# Patient Record
Sex: Female | Born: 1975 | Race: White | Hispanic: No | Marital: Married | Smoking: Never smoker
Health system: Southern US, Community
[De-identification: ages and names within clinical notes are randomized; demographics above are authoritative.]

---

## 2006-02-26 ENCOUNTER — Emergency Department (HOSPITAL_COMMUNITY): Admission: EM | Admit: 2006-02-26 | Discharge: 2006-02-26 | Payer: Self-pay | Admitting: Emergency Medicine

## 2006-03-12 ENCOUNTER — Emergency Department (HOSPITAL_COMMUNITY): Admission: EM | Admit: 2006-03-12 | Discharge: 2006-03-12 | Payer: Self-pay | Admitting: Emergency Medicine

## 2007-06-25 IMAGING — US US OB COMP LESS 14 WK
1 series · 14 of 28 positions shown · non-contrast
Comparison: none

CLINICAL DATA: 30-year-old with vomiting and abdominal pain.  LMP would make the patient 5 weeks and 5 days, yielding EDC of 11/07/06.
 OBSTETRICAL ULTRASOUND <14 WKS AND TRANSVAGINAL OB US:
TECHNIQUE: Both transabdominal and transvaginal ultrasound examinations were performed for complete evaluation of the gestation as well as the maternal uterus, adnexal regions, and pelvic cul-de-sac.

[Series 1: ob · 0.25mm/px · 14 of 51 slices shown]
[im 2/51]
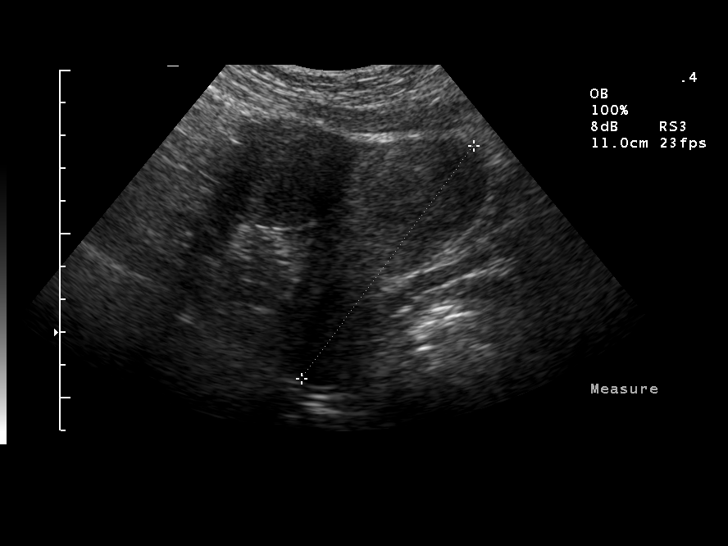
[im 6/51]
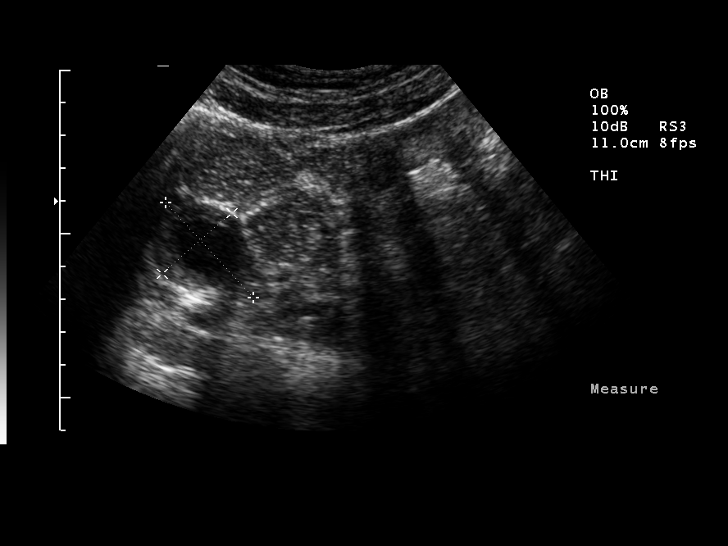
[im 10/51]
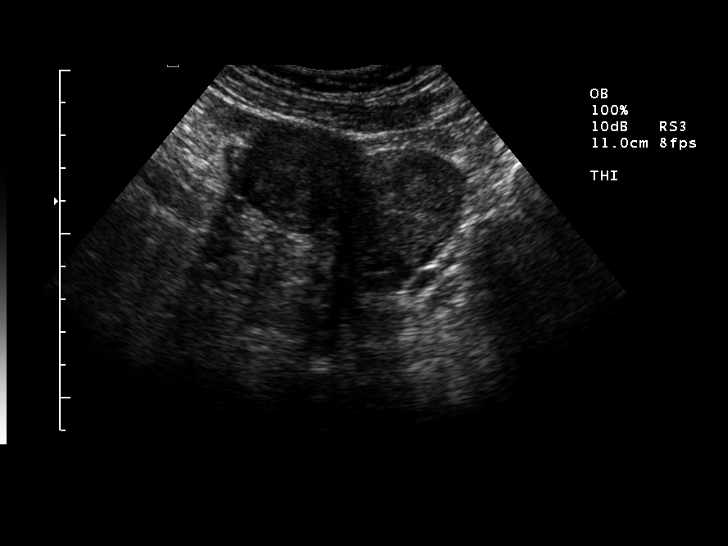
[im 13/51]
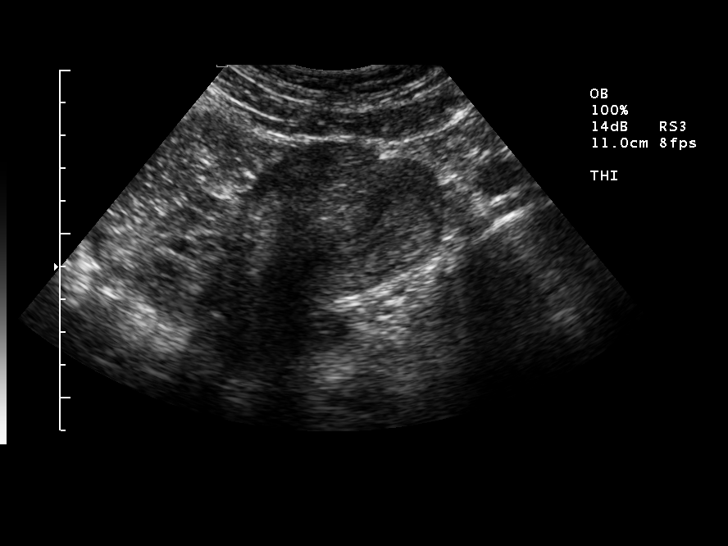
[im 17/51]
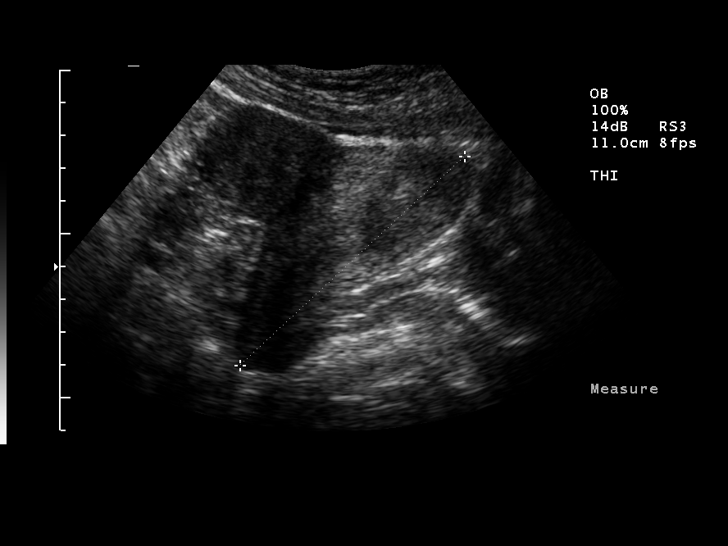
[im 21/51]
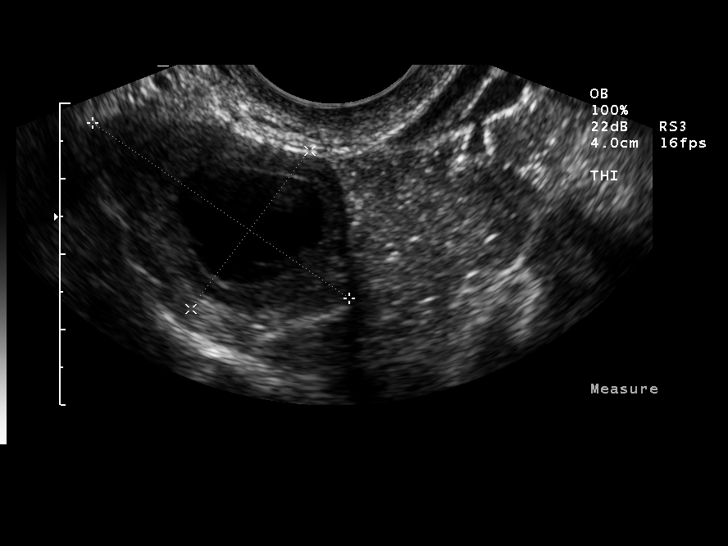
[im 25/51]
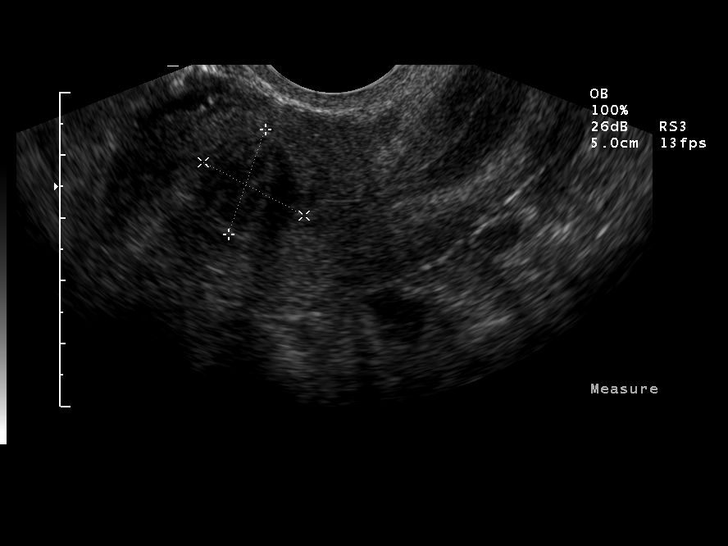
[im 28/51]
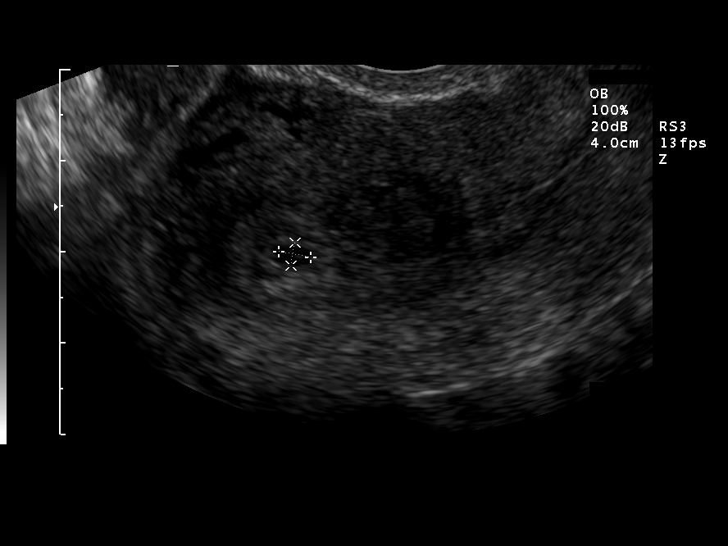
[im 32/51]
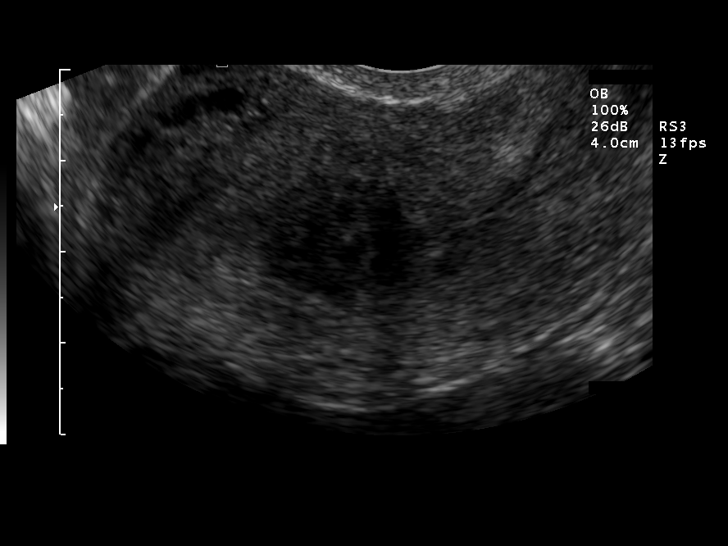
[im 36/51]
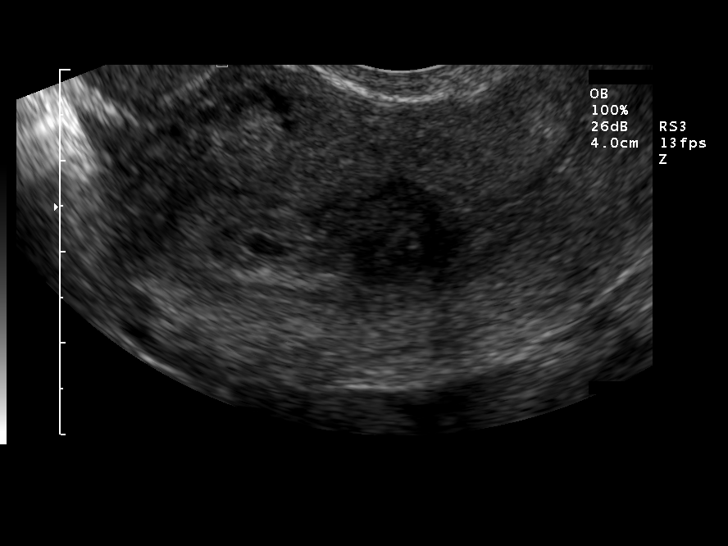
[im 39/51]
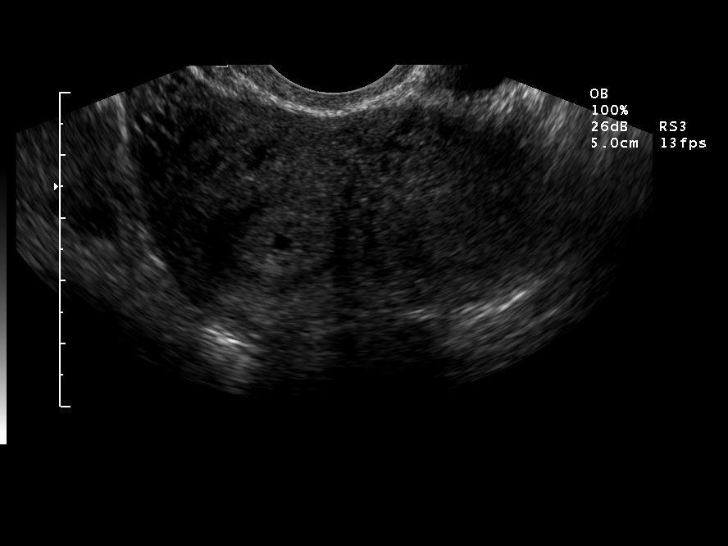
[im 43/51]
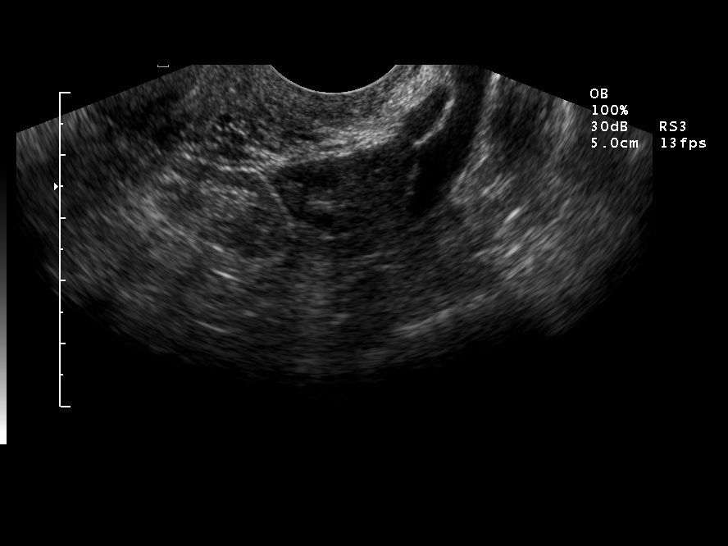
[im 47/51]
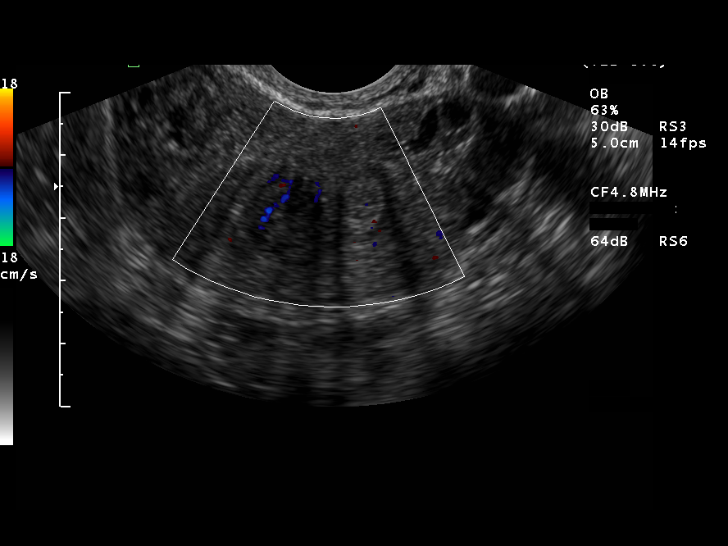
[im 51/51]
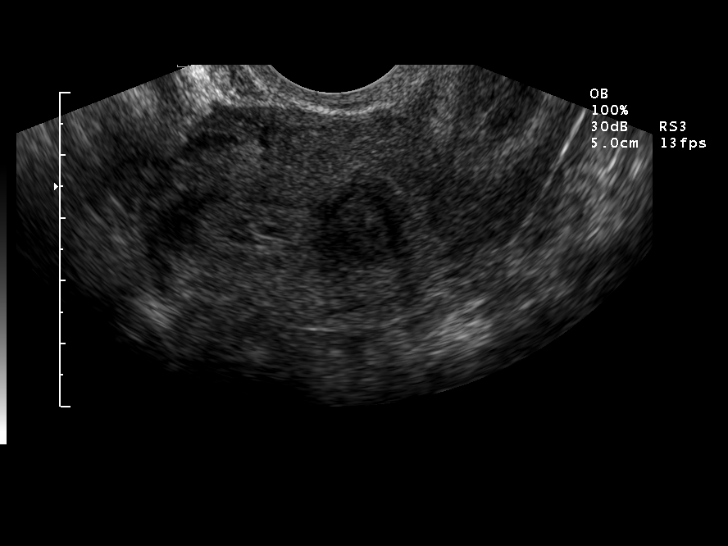

[14 of 28 positions shown; findings below may reference images not displayed]

FINDINGS: Within the uterus, there is a well-defined gestational sac with a mean sac diameter of 3 mm.  This corresponds to an age of 5 weeks 0 days.  Small yolk sac is visualized.  Embryonic pole is not seen.  No subchorionic hemorrhage is identified.
 There is a probable submucosal fibroid in the central aspect of the uterus.  This measures 1.8 x 1.8 x 1.7 cm.  A pedunculated fundal fibroid is 3.9 x 3.6 x 4.1 cm.  The ovaries have a normal appearance.  Note is made of right corpus luteum cyst 2.2 cm in diameter.
IMPRESSION: 1.  Intrauterine gestational sac.
 2.  Submucosal fibroid in the central portion of the uterus.
 3.  Pedunculated fibroid.

## 2020-09-16 ENCOUNTER — Ambulatory Visit (HOSPITAL_COMMUNITY)
Admission: EM | Admit: 2020-09-16 | Discharge: 2020-09-16 | Disposition: A | Discharge status: Home / Self Care | Payer: No Payment, Other | Attending: Psychiatry | Admitting: Psychiatry

## 2020-09-16 ENCOUNTER — Other Ambulatory Visit: Payer: Self-pay

## 2020-09-16 DIAGNOSIS — F331 Major depressive disorder, recurrent, moderate: Secondary | ICD-10-CM | POA: Insufficient documentation

## 2020-09-16 MED ORDER — TRAZODONE HCL 50 MG PO TABS: 50.0000 mg | ORAL_TABLET | Freq: Every day | ORAL | 0 refills | Status: DC

## 2020-09-16 MED ORDER — CITALOPRAM HYDROBROMIDE 20 MG PO TABS: 20.0000 mg | ORAL_TABLET | Freq: Every day | ORAL | 0 refills | Status: DC

## 2020-09-16 NOTE — Discharge Instructions (Addendum)

## 2020-09-16 NOTE — ED Provider Notes (Signed)
[facility] Medical Screening Exam  Patient Name: Elizabeth Barber MRN: [id number] Date of Evaluation: 09/16/20 Chief Complaint:   Diagnosis:  Final diagnoses:  MDD (major depressive disorder), recurrent episode, moderate (HCC)    History of Present illness: Elizabeth Barber is a 44 y.o. female.  Patient presents voluntarily as a walk-in to the [facility].  Patient reports that she has that with depression since her mid 56s and has been on various medications over the years.  She states that she usually runs into issues of moving, losing her job, or having no insurance and has had to stop medications.  She reports that the last medication she was on with Celexa 20 mg p.o. daily.  She states that it seemed to work pretty well for her but was unable to continue it because she did not sign up for the program at the community clinic in [facility].  She reports a history of being on Wellbutrin (which she really liked), Zoloft, Prozac, and Lexapro which she did not like any of those 3.  She reports that she lives with her husband and her 60 year old daughter.  She states that she has been having some worsening depressive symptoms because of being off of her medications and not been in therapy.  She states that she is looking to restart her medications as well as find a new place to be seen.  She states she went to [facility] and they informed her that would be approximately 2 months for her to be seen.  After discussing with patient I have agreed to restart her Celexa 20 mg p.o. daily, and add trazodone 50 mg p.o. nightly as patient reports disrupted sleep.  She denies having any suicidal or homicidal ideations and denies any hallucinations.  Patient's medications be E prescribed to pharmacy of choice.  Patient is also informed of open access at the [facility] and provided with information for days and hours of operation.  Psychiatric Specialty Exam  Presentation  General Appearance:Appropriate for  Environment; Casual  Eye Contact:Good  Speech:Clear and Coherent; Normal Rate  Speech Volume:Normal  Handedness:Right   Mood and Affect  Mood:Depressed  Affect:Appropriate; Congruent   Thought Process  Thought Processes:Coherent  Descriptions of Associations:Intact  Orientation:Full (Time, Place and Person)  Thought Content:WDL  Hallucinations:None  Ideas of Reference:None  Suicidal Thoughts:No  Homicidal Thoughts:No   Sensorium  Memory:Immediate Good; Recent Good; Remote Good  Judgment:Good  Insight:Good   Executive Functions  Concentration:Good  Attention Span:Good  Recall:Good  Fund of Knowledge:Good  Language:Good   Psychomotor Activity  Psychomotor Activity:Normal   Assets  Assets:Communication Skills; Desire for Improvement; Housing; Social Support; Physical Health; Transportation   Sleep  Sleep:Fair  Number of hours: No data recorded  Physical Exam: Physical Exam Vitals and nursing note reviewed.  Constitutional:      Appearance: She is well-developed.  HENT:     Head: Normocephalic.  Eyes:     Pupils: Pupils are equal, round, and reactive to light.  Cardiovascular:     Rate and Rhythm: Normal rate.  Pulmonary:     Effort: Pulmonary effort is normal.  Musculoskeletal:        General: Normal range of motion.  Neurological:     Mental Status: She is alert and oriented to person, place, and time.    Review of Systems  Constitutional: Negative.   HENT: Negative.   Eyes: Negative.   Respiratory: Negative.   Cardiovascular: Negative.   Gastrointestinal: Negative.   Genitourinary: Negative.  Musculoskeletal: Negative.   Skin: Negative.   Neurological: Negative.   Endo/Heme/Allergies: Negative.   Psychiatric/Behavioral: Positive for depression.   Blood pressure 139/78, pulse (!) 118, temperature 98.6 F (37 C), temperature source Tympanic, resp. rate 18, height 5\' 4"  (1.626 m), weight 220 lb (99.8 kg), SpO2 100 %.  Body mass index is 37.76 kg/m.  Musculoskeletal: Strength & Muscle Tone: within normal limits Gait & Station: normal Patient leans: N/A   [facility] MSE Discharge Disposition for Follow up and Recommendations: Based on my evaluation the patient does not appear to have an emergency medical condition and can be discharged with resources and follow up care in outpatient services for Medication Management and Individual Therapy   , FNP 09/16/2020, 2:59 PM

## 2020-09-16 NOTE — Progress Notes (Signed)
Pt was discharged and received AVS. Questions were answered. Pt retrieved personal belongings and was escorted to lobby.

## 2020-09-16 NOTE — ED Triage Notes (Signed)
Pt presented as a walk-in to [facility] with chief complaint of depression. Pt reports having depression since she was in her 89s. Pt states she was receiving Celexa from "[facility] in [facility]," but has been off medication for "a few months because I didn't re-sign up." Pt states her depression has worsened over the last few weeks and is wanting to start medication again. Pt denies SI, HI, and AVH.

## 2020-09-16 NOTE — BH Assessment (Signed)
Comprehensive Clinical Assessment (CCA) Screening, Triage and Referral Note  09/16/2020 Elizabeth Barber [id number]   Patient is a 44 year old female with a history of anxiety and depression who presents voluntarily to [facility] for assessment. Patient states she was receiving celexa from [facility] in [place].  Apparently, there is a yearly renewal with their clinic, which she did not complete so the medication was discontinued.  Patient has been experiencing worsening depression and difficulty sleeping, so she contacted [facility] in [facility] to establish with a provider.  They are unable to see patient until February 2022, so they referred patient to [facility].  Patient denies SI, HI, AVH and substance use history. She is interested in referral for outpatient treatment, hoping to be seen before February.    Disposition: Per Reola Calkins, NP patient does not meet criteria for inpatient treatment.  She has been referred to [facility].  Contact information, to include open access hours, has been included in the AVS to be provided to pt upon d/c.   Chief Complaint:  Chief Complaint  Patient presents with   Depression   Visit Diagnosis: Depressive Disorder Unspecified  Patient Reported Information How did you hear about [place]? Other (Comment) (Phreesia 09/16/2020)   Referral name: [facility] Narda Bonds 09/16/2020)   Referral phone number: No data recorded Whom do you see for routine medical problems? Other (Comment) (Phreesia 09/16/2020)   Practice/Facility Name: [facility] (Phreesia 09/16/2020)   Practice/Facility Phone Number: No data recorded  Name of Contact: No data recorded  Contact Number: No data recorded  Contact Fax Number: No data recorded  Prescriber Name: No data recorded  Prescriber Address (if known): No data recorded What Is the Reason for Your Visit/Call Today? Depression (Phreesia 09/16/2020)  How Long Has This Been Causing You  Problems? 1-6 months (Phreesia 09/16/2020)  Have You Recently Been in Any Inpatient Treatment (Hospital/Detox/Crisis Center/28-Day Program)? No (Phreesia 09/16/2020)   Name/Location of Program/Hospital:No data recorded  How Long Were You There? No data recorded  When Were You Discharged? No data recorded Have You Ever Received Services From [facility] Before? No (Phreesia 09/16/2020)   Who Do You See at [facility]? No data recorded Have You Recently Had Any Thoughts About Hurting Yourself? No (Phreesia 09/16/2020)   Are You Planning to Commit Suicide/Harm Yourself At This time?  No (Phreesia 09/16/2020)  Have you Recently Had Thoughts About Hurting Someone Karolee Ohs? No (Phreesia 09/16/2020)   Explanation: No data recorded Have You Used Any Alcohol or Drugs in the Past 24 Hours? No (Phreesia 09/16/2020)   How Long Ago Did You Use Drugs or Alcohol?  No data recorded  What Did You Use and How Much? No data recorded What Do You Feel Would Help You the Most Today? Medication (Phreesia 09/16/2020)  Do You Currently Have a Therapist/Psychiatrist? No (Phreesia 09/16/2020)   Name of Therapist/Psychiatrist: No data recorded  Have You Been Recently Discharged From Any Office Practice or Programs? No (Phreesia 09/16/2020)   Explanation of Discharge From Practice/Program:  No data recorded    CCA Screening Triage Referral Assessment Type of Contact: Face-to-Face   Is this Initial or Reassessment? No data recorded  Date Telepsych consult ordered in [facility]:  No data recorded  Time Telepsych consult ordered in [facility]:  No data recorded Patient Reported Information Reviewed? Yes   Patient Left Without Being Seen? No data recorded  Reason for Not Completing Assessment: No data recorded Collateral Involvement: N/A  Does Patient  Have a Automotive engineer Guardian? No data recorded  Name and Contact of Legal Guardian:  No data recorded If Minor and Not Living with Parent(s), Who has Custody? No  data recorded Is CPS involved or ever been involved? Never  Is APS involved or ever been involved? Never  Patient Determined To Be At Risk for Harm To Self or Others Based on Review of Patient Reported Information or Presenting Complaint? No   Method: No data recorded  Availability of Means: No data recorded  Intent: No data recorded  Notification Required: No data recorded  Additional Information for Danger to Others Potential:  No data recorded  Additional Comments for Danger to Others Potential:  No data recorded  Are There Guns or Other Weapons in Your Home?  No data recorded   Types of Guns/Weapons: No data recorded   Are These Weapons Safely Secured?                              No data recorded   Who Could Verify You Are Able To Have These Secured:    No data recorded Do You Have any Outstanding Charges, Pending Court Dates, Parole/Probation? No data recorded Contacted To Inform of Risk of Harm To Self or Others: No data recorded Location of Assessment: [facility]  Does Patient Present under Involuntary Commitment? No   IVC Papers Initial File Date: No data recorded  [place] of Residence: [place]  Patient Currently Receiving the Following Services: Not Receiving Services   Determination of Need: Routine (7 days)   Options For Referral: Medication Management; Outpatient Therapy   Yetta Glassman, [facility]

## 2020-09-30 ENCOUNTER — Encounter (HOSPITAL_COMMUNITY): Payer: Self-pay

## 2020-09-30 ENCOUNTER — Telehealth (INDEPENDENT_AMBULATORY_CARE_PROVIDER_SITE_OTHER): Payer: No Payment, Other | Admitting: Psychiatry

## 2020-09-30 ENCOUNTER — Encounter (HOSPITAL_COMMUNITY): Payer: Self-pay | Admitting: Psychiatry

## 2020-09-30 ENCOUNTER — Other Ambulatory Visit: Payer: Self-pay

## 2020-09-30 DIAGNOSIS — F3341 Major depressive disorder, recurrent, in partial remission: Secondary | ICD-10-CM | POA: Insufficient documentation

## 2020-09-30 MED ORDER — TRAZODONE HCL 50 MG PO TABS: 50.0000 mg | ORAL_TABLET | Freq: Every day | ORAL | 1 refills | Status: DC

## 2020-09-30 MED ORDER — CITALOPRAM HYDROBROMIDE 20 MG PO TABS: 20.0000 mg | ORAL_TABLET | Freq: Every day | ORAL | 1 refills | Status: DC

## 2020-09-30 NOTE — Progress Notes (Signed)
Psychiatric Initial Adult Assessment   Virtual Visit via Video Note  I connected with Elizabeth Barber on 09/30/20 at  9:30 AM EST by a video enabled telemedicine application and verified that I am speaking with the correct person using two identifiers.  Location: Patient: Home Provider: Clinic   I discussed the limitations of evaluation and management by telemedicine and the availability of in person appointments. The patient expressed understanding and agreed to proceed.  I provided 24 minutes of non-face-to-face time during this encounter.     Patient Identification: Elizabeth Barber MRN:  [id number] Date of Evaluation:  09/30/2020   Referral Source: Walk-in / [facility]  Chief Complaint:   " I am beginning to feel better now."  Visit Diagnosis:    ICD-10-CM   1. MDD (major depressive disorder), recurrent, in partial remission (HCC)  F33.41 citalopram (CELEXA) 20 MG tablet    traZODone (DESYREL) 50 MG tablet    History of Present Illness: This is a 45 year old female with history of MDD who was recently seen at [facility] in the urgent care.  She had presented with worsening depression symptoms on December 29 in the context of being off of her medications.  She had reported that she has taken Celexa with good results in the past however she was not able to continue taking it because of having difficulty in getting in with her community clinic for an appointment. She was evaluated and cleared for discharge, was given prescriptions for Celexa 20 mg daily, trazodone 50 mg at bedtime.  Today, patient presented as a walk-in and was given a virtual slot at 9:30 am.  Patient seen virtually and she informed that she feels since she started taking Celexa and trazodone about 2 weeks ago she is feeling a lot better now.  She stated that she is not having frequent crying spells like she used to.  Her mood is gradually improving and she feels her energy levels have  improved.  She stated that trazodone has helped her immensely with sleep and that is improved. She denied having any suicidal thoughts.  She stated that she has long history of depression since she was in her mid 40s.  She has taken several different medications over the last few years.  She stated that usually her stressors are related to her losing her job or having to move, having no insurance.  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  She lives with her daughter and her husband.  She denies any symptoms of hypomania or mania at present or in the past.  She denied any psychotic symptoms.  Past Psychiatric History: Long history of depression, has taken several different medications over the course of last few years.  Previous Psychotropic Medications: Yes  -Has taken Celexa, Wellbutrin, Zoloft, Prozac, Lexapro in the past.  She has had good results with Celexa and also with Wellbutrin in the past.  She did not really like Zoloft,  Prozac or Lexapro in the past-found to be ineffective.  Substance Abuse History in the last 12 months:  No.  Consequences of Substance Abuse: NA  Past Medical History: History reviewed. No pertinent past medical history. History reviewed. No pertinent surgical history.  Family Psychiatric History: denied  Family History: History reviewed. No pertinent family history.  Social History:   Social History   Socioeconomic History  . Marital status: Married    Spouse name: Not on file  . Number  of children: Not on file  . Years of education: Not on file  . Highest education level: Not on file  Occupational History  . Not on file  Tobacco Use  . Smoking status: Not on file  . Smokeless tobacco: Not on file  Substance and Sexual Activity  . Alcohol use: Not on file  . Drug use: Not on file  . Sexual activity: Not on file  Other Topics Concern  . Not on file  Social History Narrative  . Not on  file   Social Determinants of Health   Financial Resource Strain: Not on file  Food Insecurity: Not on file  Transportation Needs: Not on file  Physical Activity: Not on file  Stress: Not on file  Social Connections: Not on file    Additional Social History: Currently staying at home with her daughter who home schools, lives with husband and 65 year old daughter.  Allergies:  Not on File  Metabolic Disorder Labs: No results found for: HGBA1C, MPG No results found for: PROLACTIN No results found for: CHOL, TRIG, HDL, CHOLHDL, VLDL, LDLCALC No results found for: TSH  Therapeutic Level Labs: No results found for: LITHIUM No results found for: CBMZ No results found for: VALPROATE  Current Medications: Current Outpatient Medications  Medication Sig Dispense Refill  . citalopram (CELEXA) 20 MG tablet Take 1 tablet (20 mg total) by mouth daily. 30 tablet 1  . traZODone (DESYREL) 50 MG tablet Take 1 tablet (50 mg total) by mouth at bedtime. 30 tablet 1   No current facility-administered medications for this visit.    Musculoskeletal: Strength & Muscle Tone: within normal limits Gait & Station: normal Patient leans: N/A  Psychiatric Specialty Exam: Review of Systems  There were no vitals taken for this visit.There is no height or weight on file to calculate BMI.  General Appearance: Fairly Groomed  Eye Contact:  Good  Speech:  Clear and Coherent and Normal Rate  Volume:  Normal  Mood:  Depressed  Affect:  Congruent  Thought Process:  Goal Directed and Descriptions of Associations: Intact  Orientation:  Full (Time, Place, and Person)  Thought Content:  Logical  Suicidal Thoughts:  No  Homicidal Thoughts:  No  Memory:  Immediate;   Good Recent;   Good  Judgement:  Fair  Insight:  Fair  Psychomotor Activity:  Normal  Concentration:  Concentration: Good and Attention Span: Good  Recall:  Good  Fund of Knowledge:Good  Language: Good  Akathisia:  Negative  Handed:   Right  AIMS (if indicated):  0  Assets:  Communication Skills Desire for Improvement Financial Resources/Insurance Housing Social Support  ADL's:  Intact  Cognition: WNL  Sleep:  Improved with trazodone   Screenings: PHQ2-9   Flowsheet Row ED from 09/16/2020 in [facility]  PHQ-2 Total Score 6  PHQ-9 Total Score 22      Assessment and Plan: Patient was recently started on Celexa and trazodone about 2 weeks ago, patient seems to be doing better in terms of her depressive symptoms since she started taking these medicines.  She denied any side effects to these medicines and would like to continue the same regimen for now.  1. MDD (major depressive disorder), recurrent, in partial remission (HCC)  - citalopram (CELEXA) 20 MG tablet; Take 1 tablet (20 mg total) by mouth daily.  Dispense: 30 tablet; Refill: 1 - traZODone (DESYREL) 50 MG tablet; Take 1 tablet (50 mg total) by mouth at bedtime.  Dispense: 30  tablet; Refill: 1   Continue same medications. F/up in 6 weeks.  Zena Amos, MD 1/12/20229:31 AM

## 2020-11-10 ENCOUNTER — Telehealth (HOSPITAL_COMMUNITY): Payer: No Payment, Other | Admitting: Psychiatry

## 2020-12-15 ENCOUNTER — Telehealth (HOSPITAL_COMMUNITY): Payer: Self-pay | Admitting: *Deleted

## 2020-12-15 DIAGNOSIS — F3341 Major depressive disorder, recurrent, in partial remission: Secondary | ICD-10-CM

## 2020-12-15 MED ORDER — TRAZODONE HCL 50 MG PO TABS: 50.0000 mg | ORAL_TABLET | Freq: Every day | ORAL | 1 refills | Status: DC

## 2020-12-15 NOTE — Addendum Note (Signed)
Addended by: Zena Amos on: 12/15/2020 11:51 AM   Modules accepted: Orders

## 2020-12-15 NOTE — Telephone Encounter (Signed)
Call from patient seeking a new rx for her trazodone. She has an appt with her provider here on 4/1 but is out of her trazodone now. She states she has celexa. Would like it called into her preferred pharmacy. Will make this request of Dr Evelene Croon.

## 2020-12-15 NOTE — Telephone Encounter (Signed)
Done

## 2020-12-18 ENCOUNTER — Other Ambulatory Visit: Payer: Self-pay

## 2020-12-18 ENCOUNTER — Telehealth (INDEPENDENT_AMBULATORY_CARE_PROVIDER_SITE_OTHER): Payer: No Payment, Other | Admitting: Psychiatry

## 2020-12-18 ENCOUNTER — Encounter (HOSPITAL_COMMUNITY): Payer: Self-pay | Admitting: Psychiatry

## 2020-12-18 DIAGNOSIS — F3341 Major depressive disorder, recurrent, in partial remission: Secondary | ICD-10-CM

## 2020-12-18 MED ORDER — TRAZODONE HCL 100 MG PO TABS: 100.0000 mg | ORAL_TABLET | Freq: Every day | ORAL | 1 refills | Status: DC

## 2020-12-18 MED ORDER — CITALOPRAM HYDROBROMIDE 40 MG PO TABS: 40.0000 mg | ORAL_TABLET | Freq: Every day | ORAL | 1 refills | Status: DC

## 2020-12-18 NOTE — Progress Notes (Signed)
BH OP Progress Note  Virtual Visit via Telephone Note  I connected with Elizabeth Barber on 12/18/20 at 10:20 AM EDT by telephone and verified that I am speaking with the correct person using two identifiers.  Location: Patient: home Provider: Clinic   I discussed the limitations, risks, security and privacy concerns of performing an evaluation and management service by telephone and the availability of in person appointments. I also discussed with the patient that there may be a patient responsible charge related to this service. The patient expressed understanding and agreed to proceed.   I provided 16 minutes of non-face-to-face time during this encounter.       Patient Identification: Elizabeth Barber MRN:  [id number] Date of Evaluation:  12/18/2020     Chief Complaint:   " I have started to feel depressed again."  Visit Diagnosis:    ICD-10-CM   1. MDD (major depressive disorder), recurrent, in partial remission (HCC)  F33.41     History of Present Illness: Patient reported that for the past month she has noticed that she is feeling depressed again.  She denies any specific stressors or triggers.  She stated that she started to feel depressed with some crying spells.  She feels like she does not have the energy to do anything anymore and feels tired.  She also is experiencing anhedonia with poor sleep.  Her appetite is also declined. She denied any suicidal ideations. She stated that trazodone was helping her with sleep initially but it does not seem to be effective anymore.  She wakes up every 2 3 hours at night. She was agreeable to the recommendation of going up on the dose of Celexa to 40 mg as well as increasing the dose of trazodone to 100 mg at bedtime for optimal effects.   Past Psychiatric History: Long history of depression, has taken several different medications over the course of last few years.  Previous Psychotropic Medications: Yes  -Has taken Celexa, Wellbutrin,  Zoloft, Prozac, Lexapro in the past.  She has had good results with Celexa and also with Wellbutrin in the past.  She did not really like Zoloft,  Prozac or Lexapro in the past-found to be ineffective.  Substance Abuse History in the last 12 months:  No.  Consequences of Substance Abuse: NA  Past Medical History: No past medical history on file. No past surgical history on file.  Family Psychiatric History: denied  Family History: No family history on file.  Social History:   Social History   Socioeconomic History  . Marital status: Married    Spouse name: Not on file  . Number of children: Not on file  . Years of education: Not on file  . Highest education level: Not on file  Occupational History  . Not on file  Tobacco Use  . Smoking status: Not on file  . Smokeless tobacco: Not on file  Substance and Sexual Activity  . Alcohol use: Not on file  . Drug use: Not on file  . Sexual activity: Not on file  Other Topics Concern  . Not on file  Social History Narrative  . Not on file   Social Determinants of Health   Financial Resource Strain: Not on file  Food Insecurity: Not on file  Transportation Needs: Not on file  Physical Activity: Not on file  Stress: Not on file  Social Connections: Not on file    Additional Social History: Currently staying at home with her daughter who home schools, lives  with husband and 36 year old daughter.  Allergies:  Not on File  Metabolic Disorder Labs: No results found for: HGBA1C, MPG No results found for: PROLACTIN No results found for: CHOL, TRIG, HDL, CHOLHDL, VLDL, LDLCALC No results found for: TSH  Therapeutic Level Labs: No results found for: LITHIUM No results found for: CBMZ No results found for: VALPROATE  Current Medications: Current Outpatient Medications  Medication Sig Dispense Refill  . citalopram (CELEXA) 20 MG tablet Take 1 tablet (20 mg total) by mouth daily. 30 tablet 1  . traZODone (DESYREL) 50 MG  tablet Take 1 tablet (50 mg total) by mouth at bedtime. 30 tablet 1   No current facility-administered medications for this visit.    Musculoskeletal: Strength & Muscle Tone: within normal limits Gait & Station: normal Patient leans: N/A  Psychiatric Specialty Exam: Review of Systems  There were no vitals taken for this visit.There is no height or weight on file to calculate BMI.  General Appearance: Fairly Groomed  Eye Contact:  Good  Speech:  Clear and Coherent and Normal Rate  Volume:  Normal  Mood:  Depressed  Affect:  Congruent  Thought Process:  Goal Directed and Descriptions of Associations: Intact  Orientation:  Full (Time, Place, and Person)  Thought Content:  Logical  Suicidal Thoughts:  No  Homicidal Thoughts:  No  Memory:  Immediate;   Good Recent;   Good  Judgement:  Fair  Insight:  Fair  Psychomotor Activity:  Normal  Concentration:  Concentration: Good and Attention Span: Good  Recall:  Good  Fund of Knowledge:Good  Language: Good  Akathisia:  Negative  Handed:  Right  AIMS (if indicated):  0  Assets:  Communication Skills Desire for Improvement Financial Resources/Insurance Housing Social Support  ADL's:  Intact  Cognition: WNL  Sleep:  Improved with trazodone   Screenings: PHQ2-9   Flowsheet Row ED [facility]16/2020 in [facility]  PHQ-2 Total Score 6  PHQ-9 Total Score 22    Flowsheet Row ED from 09/16/2020 in [facility]  C-SSRS RISK CATEGORY Error: Question 6 not populated      Assessment and Plan: Patient reported that she has started to feel depressed over the past 1 month.  She is agreeable to increasing the doses of her medications to see if they would help her better.   1. MDD (major depressive disorder), recurrent, in partial remission (HCC)  - Increase citalopram (CELEXA) 40 MG tablet; Take 1 tablet (40 mg total) by mouth daily.  Dispense: 30 tablet; Refill: 1 - Increase  traZODone (DESYREL) 100 MG tablet; Take 1 tablet (100 mg total) by mouth at bedtime.  Dispense: 30 tablet; Refill: 1   F/up in 6 weeks.  Zena Amos, MD 4/1/202210:23 AM

## 2021-02-04 ENCOUNTER — Encounter (HOSPITAL_COMMUNITY): Payer: Self-pay | Admitting: Psychiatry

## 2021-02-04 ENCOUNTER — Telehealth (INDEPENDENT_AMBULATORY_CARE_PROVIDER_SITE_OTHER): Payer: No Payment, Other | Admitting: Psychiatry

## 2021-02-04 ENCOUNTER — Other Ambulatory Visit: Payer: Self-pay

## 2021-02-04 DIAGNOSIS — F3341 Major depressive disorder, recurrent, in partial remission: Secondary | ICD-10-CM

## 2021-02-04 MED ORDER — DOXEPIN HCL 25 MG PO CAPS: ORAL_CAPSULE | ORAL | 1 refills | Status: DC

## 2021-02-04 MED ORDER — CITALOPRAM HYDROBROMIDE 40 MG PO TABS: 40.0000 mg | ORAL_TABLET | Freq: Every day | ORAL | 1 refills | Status: DC

## 2021-02-04 NOTE — Progress Notes (Signed)
BH OP Progress Note  Virtual Visit via Video Note  I connected with Elizabeth Barber on 02/04/21 at  9:00 AM EDT by a video enabled telemedicine application and verified that I am speaking with the correct person using two identifiers.  Location: Patient: Home Provider: Clinic   I discussed the limitations of evaluation and management by telemedicine and the availability of in person appointments. The patient expressed understanding and agreed to proceed.  I provided 15 minutes of non-face-to-face time during this encounter.     Patient Identification: Elizabeth Barber MRN:  [id number] Date of Evaluation:  02/04/2021     Chief Complaint:   "I am feeling better but my sleep is still bad."   Visit Diagnosis:    ICD-10-CM   1. MDD (major depressive disorder), recurrent, in partial remission (HCC)  F33.41 citalopram (CELEXA) 40 MG tablet    doxepin (SINEQUAN) 25 MG capsule    History of Present Illness: Patient reported that her depression symptoms have improved after the dose of Celexa was increased.  She stated that she is not having any frequent crying spells anymore.  Her energy levels are better.  She feels happier now. She stated that she still continues to have poor sleep.  She stated that she is able to fall asleep with the help of trazodone but she keeps waking up every 2 hours at night and then takes about 30 minutes to fall asleep.  She stated that she is also noticed increased vivid dreams and some nightmares. She was offered trial of a different medication to help with sleep and she was agreeable to try something different.  Doxepin was offered, Potential side effects of medication and risks vs benefits of treatment vs non-treatment were explained and discussed. All questions were answered. Patient was agreeable to give that a try. She denies any other issues or concerns today.   Past Psychiatric History: Long history of depression, has taken several different medications over  the course of last few years.  Previous Psychotropic Medications: Yes  -Has taken Celexa, Wellbutrin, Zoloft, Prozac, Lexapro in the past.  She has had good results with Celexa and also with Wellbutrin in the past.  She did not really like Zoloft,  Prozac or Lexapro in the past-found to be ineffective.  Substance Abuse History in the last 12 months:  No.  Consequences of Substance Abuse: NA  Past Medical History: History reviewed. No pertinent past medical history. History reviewed. No pertinent surgical history.  Family Psychiatric History: denied  Family History: History reviewed. No pertinent family history.  Social History:   Social History   Socioeconomic History  . Marital status: Married    Spouse name: Not on file  . Number of children: Not on file  . Years of education: Not on file  . Highest education level: Not on file  Occupational History  . Not on file  Tobacco Use  . Smoking status: Not on file  . Smokeless tobacco: Not on file  Substance and Sexual Activity  . Alcohol use: Not on file  . Drug use: Not on file  . Sexual activity: Not on file  Other Topics Concern  . Not on file  Social History Narrative  . Not on file   Social Determinants of Health   Financial Resource Strain: Not on file  Food Insecurity: Not on file  Transportation Needs: Not on file  Physical Activity: Not on file  Stress: Not on file  Social Connections: Not on file  Additional Social History: Currently staying at home with her daughter who home schools, lives with husband and 17 year old daughter.  Allergies:  Not on File  Metabolic Disorder Labs: No results found for: HGBA1C, MPG No results found for: PROLACTIN No results found for: CHOL, TRIG, HDL, CHOLHDL, VLDL, LDLCALC No results found for: TSH  Therapeutic Level Labs: No results found for: LITHIUM No results found for: CBMZ No results found for: VALPROATE  Current Medications: Current Outpatient Medications   Medication Sig Dispense Refill  . doxepin (SINEQUAN) 25 MG capsule Take 1 to 2 capsules at bedtime as needed for sleep 60 capsule 1  . citalopram (CELEXA) 40 MG tablet Take 1 tablet (40 mg total) by mouth daily. 30 tablet 1   No current facility-administered medications for this visit.    Musculoskeletal: Strength & Muscle Tone: within normal limits Gait & Station: normal Patient leans: N/A  Psychiatric Specialty Exam: Review of Systems  There were no vitals taken for this visit.There is no height or weight on file to calculate BMI.  General Appearance: Fairly Groomed  Eye Contact:  Good  Speech:  Clear and Coherent and Normal Rate  Volume:  Normal  Mood:  Euthymic  Affect:  Congruent  Thought Process:  Goal Directed and Descriptions of Associations: Intact  Orientation:  Full (Time, Place, and Person)  Thought Content:  Logical  Suicidal Thoughts:  No  Homicidal Thoughts:  No  Memory:  Immediate;   Good Recent;   Good  Judgement:  Fair  Insight:  Fair  Psychomotor Activity:  Normal  Concentration:  Concentration: Good and Attention Span: Good  Recall:  Good  Fund of Knowledge:Good  Language: Good  Akathisia:  Negative  Handed:  Right  AIMS (if indicated):  0  Assets:  Communication Skills Desire for Improvement Financial Resources/Insurance Housing Social Support  ADL's:  Intact  Cognition: WNL  Sleep:  Fair, waking up frequently at night   Screenings: PHQ2-9   Flowsheet Row ED from 09/16/2020 in [facility]  PHQ-2 Total Score 6  PHQ-9 Total Score 22    Flowsheet Row ED [facility]/16/2020 in [facility]  C-SSRS RISK CATEGORY Error: Question 6 not populated      Assessment and Plan: Patient is reporting provement in her depression symptoms however still has difficulty in maintaining sleep with trazodone.  Also complained of vivid dreams and nightmares.  Was offered being switched to doxepin. Potential  side effects of medication and risks vs benefits of treatment vs non-treatment were explained and discussed. All questions were answered.    1. MDD (major depressive disorder), recurrent, in partial remission (HCC)  - citalopram (CELEXA) 40 MG tablet; Take 1 tablet (40 mg total) by mouth daily.  Dispense: 30 tablet; Refill: 1 -Start doxepin (SINEQUAN) 25 MG capsule; Take 1 to 2 capsules at bedtime as needed for sleep  Dispense: 60 capsule; Refill: 1 -Discontinue trazodone due to side effects.   Follow-up in 2 months. Patient was informed that her care is being transferred to a different provider in the clinic due to the writer leaving the office.  Zena Amos, MD 5/19/20228:59 AM

## 2021-03-23 ENCOUNTER — Telehealth: Payer: Self-pay | Admitting: Physician Assistant

## 2021-03-23 ENCOUNTER — Encounter: Payer: Self-pay | Admitting: Physician Assistant

## 2021-03-23 DIAGNOSIS — T887XXA Unspecified adverse effect of drug or medicament, initial encounter: Secondary | ICD-10-CM

## 2021-03-23 DIAGNOSIS — F339 Major depressive disorder, recurrent, unspecified: Secondary | ICD-10-CM

## 2021-03-23 MED ORDER — HYDROXYZINE PAMOATE 25 MG PO CAPS: 25.0000 mg | ORAL_CAPSULE | Freq: Three times a day (TID) | ORAL | 0 refills | Status: DC | PRN

## 2021-03-23 NOTE — Patient Instructions (Signed)
Fara Chute, thank you for joining Margaretann Loveless, PA-C for today's virtual visit.  While this provider is not your primary care provider (PCP), if your PCP is located in our provider database this encounter information will be shared with them immediately following your visit.  Consent: (Patient) Elizabeth Barber provided verbal consent for this virtual visit at the beginning of the encounter.  Current Medications:  Current Outpatient Medications:    hydrOXYzine (VISTARIL) 25 MG capsule, Take 1 capsule (25 mg total) by mouth every 8 (eight) hours as needed for anxiety., Disp: 60 capsule, Rfl: 0   citalopram (CELEXA) 40 MG tablet, Take 1 tablet (40 mg total) by mouth daily., Disp: 30 tablet, Rfl: 1   doxepin (SINEQUAN) 25 MG capsule, Take 1 to 2 capsules at bedtime as needed for sleep, Disp: 60 capsule, Rfl: 1   Medications ordered in this encounter:  Meds ordered this encounter  Medications   hydrOXYzine (VISTARIL) 25 MG capsule    Sig: Take 1 capsule (25 mg total) by mouth every 8 (eight) hours as needed for anxiety.    Dispense:  60 capsule    Refill:  0    Order Specific Question:   Supervising Provider    Answer:   Hyacinth Meeker, BRIAN [[id number]]     *If you need refills on other medications prior to your next appointment, please contact your pharmacy*  Follow-Up: Call back or seek an in-person evaluation if the symptoms worsen or if the condition fails to improve as anticipated.  Other Instructions Call Dr. Carie Caddy office to see if you can be worked in sooner than 04/02/21   If you have been instructed to have an in-person evaluation today at a local Urgent Care facility, please use the link below. It will take you to a list of all of our available [facility], including address, phone number and hours of operation. Please do not delay care.  [facility]  If you or a family member do not have a primary care provider, use the link below to schedule a visit  and establish care. When you choose a [facility] primary care physician or advanced practice provider, you gain a long-term partner in health. Find a Primary Care Provider  Learn more about [place] [facility]'s in-office and virtual care options: [place] - Get Care Now  Hydroxyzine Capsules or Tablets What is this medication? HYDROXYZINE (hye DROX i zeen) treats the symptoms of allergies and allergic reactions. It may also be used to treat anxiety or cause drowsiness before a procedure. It works by blocking histamine, a substance released by the body during an allergic reaction. It belongs to a group of medications calledantihistamines. This medicine may be used for other purposes; ask your health care provider orpharmacist if you have questions. COMMON BRAND NAME(S): ANX, Atarax, Rezine, Vistaril What should I tell my care team before I take this medication? They need to know if you have any of these conditions: Glaucoma Heart disease History of irregular heartbeat Kidney disease Liver disease Lung or breathing disease, like asthma Stomach or intestine problems Thyroid disease Trouble passing urine An unusual or allergic reaction to hydroxyzine, cetirizine, other medications, foods, dyes or preservatives Pregnant or trying to get pregnant Breast-feeding How should I use this medication? Take this medication by mouth with a full glass of water. Follow the directions on the prescription label. You may take this medication with food or on an empty stomach. Take your medication at regular intervals. Do  not take yourmedication more often than directed. Talk to your care team regarding the use of this medication in children. Special care may be needed. While this medication may be prescribed for children as young as 74 years of age for selected conditions, precautions doapply. Patients over 46 years old may have a stronger reaction and need a smaller dose. Overdosage: If you think you have taken  too much of this medicine contact apoison control center or emergency room at once. NOTE: This medicine is only for you. Do not share this medicine with others. What if I miss a dose? If you miss a dose, take it as soon as you can. If it is almost time for yournext dose, take only that dose. Do not take double or extra doses. What may interact with this medication? Do not take this medication with any of the following: Cisapride Dronedarone Pimozide Thioridazine This medication may also interact with the following: Alcohol Antihistamines for allergy, cough, and cold Atropine Barbiturate medications for sleep or seizures, like phenobarbital Certain antibiotics like erythromycin or clarithromycin Certain medications for anxiety or sleep Certain medications for bladder problems like oxybutynin, tolterodine Certain medications for depression or psychotic disturbances Certain medications for irregular heart beat Certain medications for Parkinson's disease like benztropine, trihexyphenidyl Certain medications for seizures like phenobarbital, primidone Certain medications for stomach problems like dicyclomine, hyoscyamine Certain medications for travel sickness like scopolamine Ipratropium Narcotic medications for pain Other medications that prolong the QT interval (which can cause an abnormal heart rhythm) like dofetilide This list may not describe all possible interactions. Give your health care provider a list of all the medicines, herbs, non-prescription drugs, or dietary supplements you use. Also tell them if you smoke, drink alcohol, or use illegaldrugs. Some items may interact with your medicine. What should I watch for while using this medication? Tell your care team if your symptoms do not improve. You may get drowsy or dizzy. Do not drive, use machinery, or do anything that needs mental alertness until you know how this medication affects you. Do not stand or sit up quickly,  especially if you are an older patient. This reduces the risk of dizzy or fainting spells. Alcohol may interfere with the effect ofthis medication. Avoid alcoholic drinks. Your mouth may get dry. Chewing sugarless gum or sucking hard candy, and drinking plenty of water may help. Contact your care team if the problem doesnot go away or is severe. This medication may cause dry eyes and blurred vision. If you wear contact lenses you may feel some discomfort. Lubricating drops may help. See your eyecare specialist if the problem does not go away or is severe. If you are receiving skin tests for allergies, tell your care team you areusing this medication. What side effects may I notice from receiving this medication? Side effects that you should report to your care team as soon as possible: Allergic reactions-skin rash, itching, hives, swelling of the face, lips, tongue, or throat Heart rhythm changes-fast or irregular heartbeat, dizziness, feeling faint or lightheaded, chest pain, trouble breathing Side effects that usually do not require medical attention (report to your careteam if they continue or are bothersome): Confusion Drowsiness Dry mouth Hallucinations Headache This list may not describe all possible side effects. Call your doctor for medical advice about side effects. You may report side effects to FDA at1-800-FDA-1088. Where should I keep my medication? Keep out of the reach of children and pets. Store at room temperature between 15 and 30 degrees C (59  and 86 degrees F). Keep container tightly closed. Throw away any unused medication after theexpiration date. NOTE: This sheet is a summary. It may not cover all possible information. If you have questions about this medicine, talk to your doctor, pharmacist, orhealth care provider.  2022 Elsevier/Gold Standard (2020-11-17 15:19:25)

## 2021-03-23 NOTE — Progress Notes (Signed)
Elizabeth Barber are scheduled for a virtual visit with your provider today.    Just as we do with appointments in the office, we must obtain your consent to participate.  Your consent will be active for this visit and any virtual visit you may have with one of our providers in the next 365 days.    If you have a MyChart account, I can also send a copy of this consent to you electronically.  All virtual visits are billed to your insurance company just like a traditional visit in the office.  As this is a virtual visit, video technology does not allow for your provider to perform a traditional examination.  This may limit your provider's ability to fully assess your condition.  If your provider identifies any concerns that need to be evaluated in person or the need to arrange testing such as labs, EKG, etc, we will make arrangements to do so.    Although advances in technology are sophisticated, we cannot ensure that it will always work on either your end or our end.  If the connection with a video visit is poor, we may have to switch to a telephone visit.  With either a video or telephone visit, we are not always able to ensure that we have a secure connection.   I need to obtain your verbal consent now.   Are you willing to proceed with your visit today?   Elizabeth Barber has provided verbal consent on 03/23/2021 for a virtual visit (video or telephone).   Elizabeth Loveless, PA-C 03/23/2021  10:59 AM  Virtual Visit Consent   Elizabeth Barber, you are scheduled for a virtual visit with a [facility] provider today.     Just as with appointments in the office, your consent must be obtained to participate.  Your consent will be active for this visit and any virtual visit you may have with one of our providers in the next 365 days.     If you have a MyChart account, a copy of this consent can be sent to you electronically.  All virtual visits are billed to your insurance company just like a traditional  visit in the office.    As this is a virtual visit, video technology does not allow for your provider to perform a traditional examination.  This may limit your provider's ability to fully assess your condition.  If your provider identifies any concerns that need to be evaluated in person or the need to arrange testing (such as labs, EKG, etc.), we will make arrangements to do so.     Although advances in technology are sophisticated, we cannot ensure that it will always work on either your end or our end.  If the connection with a video visit is poor, the visit may have to be switched to a telephone visit.  With either a video or telephone visit, we are not always able to ensure that we have a secure connection.     I need to obtain your verbal consent now.   Are you willing to proceed with your visit today?    Elizabeth Barber has provided verbal consent on 03/23/2021 for a virtual visit (video or telephone).   Elizabeth Loveless, PA-C   Date: 03/23/2021 10:59 AM   Virtual Visit via Video Note   I, Elizabeth Barber, connected with  Elizabeth Barber  ([id number], 1975/10/08) on 03/23/21 at 10:30 AM EDT by a video-enabled telemedicine application and verified that I am  speaking with the correct person using two identifiers.  Location: Patient: Virtual Visit Location Patient: Home Provider: Virtual Visit Location Provider: Home Office   I discussed the limitations of evaluation and management by telemedicine and the availability of in person appointments. The patient expressed understanding and agreed to proceed.    History of Present Illness: Elizabeth Barber is a 45 y.o. who identifies as a female who was assigned female at birth, and is being seen today for worsening depression. She is currently on Citalopram 40mg  daily. She reports this was helping her in the beginning, but now feels it is not as effective. Also she is having side effects from Doxepin. This was added in April for sleep. She has  been taking regularly, but does not like the way it makes her feel. She is having daytime drowsiness, headaches, muscle twitching all the following morning after taking doxepin. She is followed by Dr. May, and has a scheduled follow up appt on 04/02/21.   Problems:  Patient Active Problem List   Diagnosis Date Noted   MDD (major depressive disorder), recurrent, in partial remission (HCC) 09/30/2020    Allergies: Not on File Medications:  Current Outpatient Medications:    hydrOXYzine (VISTARIL) 25 MG capsule, Take 1 capsule (25 mg total) by mouth every 8 (eight) hours as needed for anxiety., Disp: 60 capsule, Rfl: 0   citalopram (CELEXA) 40 MG tablet, Take 1 tablet (40 mg total) by mouth daily., Disp: 30 tablet, Rfl: 1   doxepin (SINEQUAN) 25 MG capsule, Take 1 to 2 capsules at bedtime as needed for sleep, Disp: 60 capsule, Rfl: 1  Observations/Objective: Patient is well-developed, well-nourished in no acute distress.  Resting comfortably at home.  Head is normocephalic, atraumatic.  No labored breathing. Speech is clear and coherent with logical content.  Patient is alert and oriented at baseline.  Depressed and flat affect  Assessment and Plan: 1. Depression, recurrent (HCC) - hydrOXYzine (VISTARIL) 25 MG capsule; Take 1 capsule (25 mg total) by mouth every 8 (eight) hours as needed for anxiety.  Dispense: 60 capsule; Refill: 0  2. Medication side effect - Advised patient we do not alter SSRI/SNRI treatments due to need to be followed - Advised patient to discontinue doxepin due to side effects - Continue Citalopram 40mg  - Add Hydroxyzine as above for acute anxiety/panic attacks as needed, but take 1 nightly at bedtime for sleep - Call psychiatrist to see if she can be worked in sooner  Follow Up Instructions: I discussed the assessment and treatment plan with the patient. The patient was provided an opportunity to ask questions and all were answered. The patient agreed with the  plan and demonstrated an understanding of the instructions.  A copy of instructions were sent to the patient via MyChart.  The patient was advised to call back or seek an in-person evaluation if the symptoms worsen or if the condition fails to improve as anticipated.  Time:  I spent 11 minutes with the patient via telehealth technology discussing the above problems/concerns.    11/28/2020, PA-C

## 2021-04-02 ENCOUNTER — Other Ambulatory Visit: Payer: Self-pay

## 2021-04-02 ENCOUNTER — Encounter (HOSPITAL_COMMUNITY): Payer: Self-pay | Admitting: Physician Assistant

## 2021-04-02 ENCOUNTER — Telehealth (INDEPENDENT_AMBULATORY_CARE_PROVIDER_SITE_OTHER): Payer: No Payment, Other | Admitting: Physician Assistant

## 2021-04-02 DIAGNOSIS — F411 Generalized anxiety disorder: Secondary | ICD-10-CM

## 2021-04-02 DIAGNOSIS — F3341 Major depressive disorder, recurrent, in partial remission: Secondary | ICD-10-CM | POA: Diagnosis not present

## 2021-04-02 DIAGNOSIS — G4700 Insomnia, unspecified: Secondary | ICD-10-CM | POA: Diagnosis not present

## 2021-04-02 MED ORDER — CITALOPRAM HYDROBROMIDE 40 MG PO TABS: 40.0000 mg | ORAL_TABLET | Freq: Every day | ORAL | 1 refills | Status: DC

## 2021-04-02 MED ORDER — BUSPIRONE HCL 7.5 MG PO TABS: 7.5000 mg | ORAL_TABLET | Freq: Two times a day (BID) | ORAL | 1 refills | Status: DC

## 2021-04-02 MED ORDER — BUPROPION HCL ER (XL) 150 MG PO TB24: 150.0000 mg | ORAL_TABLET | ORAL | 1 refills | Status: DC

## 2021-04-02 MED ORDER — TRAZODONE HCL 150 MG PO TABS: 150.0000 mg | ORAL_TABLET | Freq: Every day | ORAL | 1 refills | Status: DC

## 2021-04-07 ENCOUNTER — Telehealth (HOSPITAL_COMMUNITY): Payer: Self-pay | Admitting: *Deleted

## 2021-04-07 ENCOUNTER — Encounter (HOSPITAL_COMMUNITY): Payer: Self-pay | Admitting: Physician Assistant

## 2021-04-07 NOTE — Telephone Encounter (Signed)
Request for patients doxepin which states it was last filled on 6/17. Dr Evelene Croon started it on 02/04/21. Will refer the request to provider [place] PA.

## 2021-04-07 NOTE — Progress Notes (Signed)
BH MD/PA/NP OP Progress Note  Virtual Visit via Video Note  I connected with Elizabeth Barber on 04/07/21 at 11:30 AM EDT by a video enabled telemedicine application and verified that I am speaking with the correct person using two identifiers.  Location: Patient: Home Provider: Clinic   I discussed the limitations of evaluation and management by telemedicine and the availability of in person appointments. The patient expressed understanding and agreed to proceed.  Follow Up Instructions:  I discussed the assessment and treatment plan with the patient. The patient was provided an opportunity to ask questions and all were answered. The patient agreed with the plan and demonstrated an understanding of the instructions.   The patient was advised to call back or seek an in-person evaluation if the symptoms worsen or if the condition fails to improve as anticipated.  I provided 20 minutes of non-face-to-face time during this encounter.  Meta Hatchet, PA   04/02/2021 2:34 PM Trenise Turay  MRN:  [id number]  Chief Complaint: Follow up and medication management  HPI:   Elizabeth Barber is a 45 year old female with a past psychiatric history significant for major depressive disorder who presents to [facility] via virtual video visit for follow-up and medication management.  Patient is currently being managed on the following medications:  Citalopram 40 mg daily Doxepin 25 mg 1-2 tab at bedtime as needed Hydroxyzine 25 mg at every 8 hours as needed  Patient reports that she has been experiencing depressive symptoms as of late.  She states that she had to call a hotline the previous week due to feeling mad, sad, and "not all there."  Patient reports that she would like to discontinue taking doxepin due to experiencing jerking spasms within her muscles.  She also reports that hydroxyzine has not been helpful in the management of her anxiety.  Patient feels  as though hydroxyzine is not as helpful with sedation.  In terms of sleep aid medications, patient has been on both doxepin and trazodone in the past.  Due to her depressive episodes, patient is interested in being placed on Wellbutrin due to having some success with the medication in the past.  A PHQ-9 screen was performed with the patient scoring a 21.  A GAD-7 screen was also performed with the patient scoring an 18.  Patient is calm, cooperative, and fully engaged in conversation during the encounter.  Patient reports that she does not feel like herself.  Patient denies suicidal or homicidal ideations.  She further denies auditory or visual hallucinations and does not appear to be responding to internal/external stimuli.  Patient endorses poor sleep and receives on average 4 hours of intermittent sleep.  Patient reports that she wakes up throughout the night.  Patient endorses good appetite and eats on average 3 meals per day.  Patient denies alcohol consumption, tobacco use, and illicit drug use.  Visit Diagnosis:    ICD-10-CM   1. Generalized anxiety disorder  F41.1 citalopram (CELEXA) 40 MG tablet    busPIRone (BUSPAR) 7.5 MG tablet    2. MDD (major depressive disorder), recurrent, in partial remission (HCC)  F33.41 citalopram (CELEXA) 40 MG tablet    buPROPion (WELLBUTRIN XL) 150 MG 24 hr tablet    busPIRone (BUSPAR) 7.5 MG tablet    3. Insomnia, unspecified type  G47.00 traZODone (DESYREL) 150 MG tablet      Past Psychiatric History:  Long history of depression, has taken several different medications over the course of last  few years  Past Medical History: History reviewed. No pertinent past medical history. History reviewed. No pertinent surgical history.  Family Psychiatric History:  Denied  Family History: History reviewed. No pertinent family history.  Social History:  Social History   Socioeconomic History   Marital status: Married    Spouse name: Not on file   Number  of children: Not on file   Years of education: Not on file   Highest education level: Not on file  Occupational History   Not on file  Tobacco Use   Smoking status: Never   Smokeless tobacco: Not on file  Substance and Sexual Activity   Alcohol use: Not Currently   Drug use: Not Currently   Sexual activity: Not on file  Other Topics Concern   Not on file  Social History Narrative   Not on file   Social Determinants of Health   Financial Resource Strain: Not on file  Food Insecurity: Not on file  Transportation Needs: Not on file  Physical Activity: Not on file  Stress: Not on file  Social Connections: Not on file    Allergies: Not on File  Metabolic Disorder Labs: No results found for: HGBA1C, MPG No results found for: PROLACTIN No results found for: CHOL, TRIG, HDL, CHOLHDL, VLDL, LDLCALC No results found for: TSH  Therapeutic Level Labs: No results found for: LITHIUM No results found for: VALPROATE No components found for:  CBMZ  Current Medications: Current Outpatient Medications  Medication Sig Dispense Refill   buPROPion (WELLBUTRIN XL) 150 MG 24 hr tablet Take 1 tablet (150 mg total) by mouth every morning. 30 tablet 1   busPIRone (BUSPAR) 7.5 MG tablet Take 1 tablet (7.5 mg total) by mouth 2 (two) times daily. 60 tablet 1   traZODone (DESYREL) 150 MG tablet Take 1 tablet (150 mg total) by mouth at bedtime. 30 tablet 1   citalopram (CELEXA) 40 MG tablet Take 1 tablet (40 mg total) by mouth daily. 30 tablet 1   hydrOXYzine (VISTARIL) 25 MG capsule Take 1 capsule (25 mg total) by mouth every 8 (eight) hours as needed for anxiety. 60 capsule 0   No current facility-administered medications for this visit.     Musculoskeletal: Strength & Muscle Tone: Unable to assess due to telemedicine visit Gait & Station: Unable to assess due to telemedicine visit Patient leans: Unable to assess due to telemedicine visit  Psychiatric Specialty Exam: Review of Systems   Psychiatric/Behavioral:  Positive for sleep disturbance. Negative for decreased concentration, dysphoric mood, hallucinations, self-injury and suicidal ideas. The patient is nervous/anxious. The patient is not hyperactive.    There were no vitals taken for this visit.There is no height or weight on file to calculate BMI.  General Appearance: Well Groomed  Eye Contact:  Good  Speech:  Clear and Coherent and Normal Rate  Volume:  Normal  Mood:  Anxious and Depressed  Affect:  Congruent and Depressed  Thought Process:  Coherent, Goal Directed, and Descriptions of Associations: Intact  Orientation:  Full (Time, Place, and Person)  Thought Content: WDL   Suicidal Thoughts:  No  Homicidal Thoughts:  No  Memory:  Immediate;   Good Recent;   Good Remote;   Good  Judgement:  Fair  Insight:  Fair  Psychomotor Activity:  Normal  Concentration:  Concentration: Good and Attention Span: Good  Recall:  Good  Fund of Knowledge: Good  Language: Good  Akathisia:  Negative  Handed:  Right  AIMS (if indicated): not  done  Assets:  Communication Skills Desire for Improvement Financial Resources/Insurance Housing Social Support  ADL's:  Intact  Cognition: WNL  Sleep:  Fair   Screenings: GAD-7    Flowsheet Row Video Visit from 04/02/2021 in [facility]  Total GAD-7 Score 18      PHQ2-9    Flowsheet Row Video Visit from 04/02/2021 in [facility] ED from 09/16/2020 in [facility]  PHQ-2 Total Score 4 6  PHQ-9 Total Score 21 22      Flowsheet Row Video Visit from 04/02/2021 in [facility] from 09/16/2020 in [facility]  C-SSRS RISK CATEGORY No Risk Error: Question 6 not populated        Assessment and Plan:   Mazy Culton is a 45 year old female with a past psychiatric history significant for major depressive disorder who presents to  [facility] via virtual video visit for follow-up and medication management.  Patient reports that she is still experiencing depressive episodes as well as worsening anxiety.  Patient is interested in being placed on Wellbutrin for the management of her depressive episodes.  Patient to be placed on Wellbutrin XL 150 mg 24-hour tablets daily.  Patient was also recommended buspirone 7.5 mg 2 times daily for the management of her anxiety and depressive episodes.  Lastly, patient was recommended trazodone 150 mg at bedtime for the management of her sleep.  Patient was agreeable to recommendations.  Patient's medications to be prescribed to pharmacy of choice.  1. MDD (major depressive disorder), recurrent, in partial remission (HCC)  - citalopram (CELEXA) 40 MG tablet; Take 1 tablet (40 mg total) by mouth daily.  Dispense: 30 tablet; Refill: 1 - buPROPion (WELLBUTRIN XL) 150 MG 24 hr tablet; Take 1 tablet (150 mg total) by mouth every morning.  Dispense: 30 tablet; Refill: 1 - busPIRone (BUSPAR) 7.5 MG tablet; Take 1 tablet (7.5 mg total) by mouth 2 (two) times daily.  Dispense: 60 tablet; Refill: 1  2. Generalized anxiety disorder  - citalopram (CELEXA) 40 MG tablet; Take 1 tablet (40 mg total) by mouth daily.  Dispense: 30 tablet; Refill: 1 - busPIRone (BUSPAR) 7.5 MG tablet; Take 1 tablet (7.5 mg total) by mouth 2 (two) times daily.  Dispense: 60 tablet; Refill: 1  3. Insomnia, unspecified type  - traZODone (DESYREL) 150 MG tablet; Take 1 tablet (150 mg total) by mouth at bedtime.  Dispense: 30 tablet; Refill: 1  Patient to follow-up in 2 months Provider spent a total of 20 minutes with the patient/reviewing patient's chart  Meta Hatchet, PA 04/02/2021, 2:34 PM

## 2021-04-07 NOTE — Telephone Encounter (Signed)
Provider was contacted by Wynona Luna, RN regarding medication refill. Patient discontinued doxepin for the management of her sleep and was placed on Trazodone.

## 2021-06-01 ENCOUNTER — Other Ambulatory Visit (HOSPITAL_COMMUNITY): Payer: Self-pay | Admitting: Physician Assistant

## 2021-06-01 DIAGNOSIS — G4700 Insomnia, unspecified: Secondary | ICD-10-CM

## 2021-06-01 DIAGNOSIS — F411 Generalized anxiety disorder: Secondary | ICD-10-CM

## 2021-06-01 DIAGNOSIS — F3341 Major depressive disorder, recurrent, in partial remission: Secondary | ICD-10-CM

## 2021-06-04 ENCOUNTER — Other Ambulatory Visit: Payer: Self-pay

## 2021-06-04 ENCOUNTER — Telehealth (INDEPENDENT_AMBULATORY_CARE_PROVIDER_SITE_OTHER): Payer: No Payment, Other | Admitting: Physician Assistant

## 2021-06-04 DIAGNOSIS — G4700 Insomnia, unspecified: Secondary | ICD-10-CM | POA: Diagnosis not present

## 2021-06-04 DIAGNOSIS — F3341 Major depressive disorder, recurrent, in partial remission: Secondary | ICD-10-CM | POA: Diagnosis not present

## 2021-06-04 DIAGNOSIS — F411 Generalized anxiety disorder: Secondary | ICD-10-CM

## 2021-06-04 DIAGNOSIS — F339 Major depressive disorder, recurrent, unspecified: Secondary | ICD-10-CM

## 2021-06-04 MED ORDER — CITALOPRAM HYDROBROMIDE 40 MG PO TABS: 40.0000 mg | ORAL_TABLET | Freq: Every day | ORAL | 1 refills | Status: DC

## 2021-06-04 MED ORDER — BUPROPION HCL ER (XL) 300 MG PO TB24: 300.0000 mg | ORAL_TABLET | Freq: Every morning | ORAL | 1 refills | Status: DC

## 2021-06-04 MED ORDER — HYDROXYZINE PAMOATE 25 MG PO CAPS: 25.0000 mg | ORAL_CAPSULE | Freq: Three times a day (TID) | ORAL | 0 refills | Status: DC | PRN

## 2021-06-04 MED ORDER — RAMELTEON 8 MG PO TABS
8.0000 mg | ORAL_TABLET | Freq: Every day | ORAL | 1 refills | Status: DC
Start: 2021-06-04 — End: 2021-07-24

## 2021-06-04 MED ORDER — BUSPIRONE HCL 10 MG PO TABS: 10.0000 mg | ORAL_TABLET | Freq: Two times a day (BID) | ORAL | 1 refills | Status: AC

## 2021-06-04 NOTE — Progress Notes (Signed)
BH MD/PA/NP OP Progress Note  Virtual Visit via Video Note  I connected with Elizabeth Barber on 06/07/21 at  2:30 PM EDT by a video enabled telemedicine application and verified that I am speaking with the correct person using two identifiers.  Location: Patient: Home Provider: Clinic   I discussed the limitations of evaluation and management by telemedicine and the availability of in person appointments. The patient expressed understanding and agreed to proceed.  Follow Up Instructions:  I discussed the assessment and treatment plan with the patient. The patient was provided an opportunity to ask questions and all were answered. The patient agreed with the plan and demonstrated an understanding of the instructions.   The patient was advised to call back or seek an in-person evaluation if the symptoms worsen or if the condition fails to improve as anticipated.  I provided 25 minutes of non-face-to-face time during this encounter.  Elizabeth Hatchet, PA    06/07/2021 4:17 PM Elizabeth Barber  MRN:  [id number]  Chief Complaint: Follow up and medication management  HPI:   Elizabeth Barber is a 45 year old female with a past psychiatric history significant for major depressive disorder who presents to [facility] via virtual video visit for follow-up and medication management.  Patient is currently being managed on the following medications:  Bupropion (Wellbutrin XL) 150 mg 24-hour tablet daily Buspirone 7.5 mg 3 times daily Citalopram 40 mg daily Trazodone 150 mg at bedtime Hydroxyzine 25 mg every 8 hours as needed  Patient reports that things are going well with her medications, however, she has been having issues with her mood.  Patient states that she has been plagued with an overwhelming sense of sadness.  She states that she is often overcome with depressive episodes that occur out of the blue.  Patient endorses the following symptoms: crying  spells, lack of motivation, difficulty getting out of bed, and sleep disturbances.  Patient also endorses anxiety she rates an 8 out of 10.  Patient's main stressor relates to ongoing marital issues that have been going on for a year.  Patient believes that there is no resolution to her issues.  A PHQ-9 screen was performed with the patient scoring a 20.  A GAD-7 screen was also performed with the patient scoring an 18.  Patient is alert and oriented x4, calm, cooperative, and fully engaged in conversation during the encounter.  Patient endorses okay mood.  Patient denies suicidal or homicidal ideations.  Patient further denies auditory or visual hallucinations and does not appear to be responding to internal/external stimuli.  Patient endorses fair sleep and receives on average 7 hours of sleep a night with instances of waking up every 2 hours.  Patient states that she has also been having vivid dreams since taking her trazodone.  Patient endorses good appetite and eats on average 3 meals per day.  Patient denies alcohol use, tobacco use, and illicit drug use.  Visit Diagnosis:    ICD-10-CM   1. MDD (major depressive disorder), recurrent, in partial remission (HCC)  F33.41 buPROPion (WELLBUTRIN XL) 300 MG 24 hr tablet    busPIRone (BUSPAR) 10 MG tablet    citalopram (CELEXA) 40 MG tablet    2. Generalized anxiety disorder  F41.1 busPIRone (BUSPAR) 10 MG tablet    citalopram (CELEXA) 40 MG tablet    3. Depression, recurrent (HCC)  F33.9 hydrOXYzine (VISTARIL) 25 MG capsule    4. Insomnia, unspecified type  G47.00 ramelteon (ROZEREM) 8 MG tablet  Past Psychiatric History:  Long history of depression, has taken several different medications over the course of last few years  Past Medical History: No past medical history on file. No past surgical history on file.  Family Psychiatric History:  Denied  Family History: No family history on file.  Social History:  Social History    Socioeconomic History   Marital status: Married    Spouse name: Not on file   Number of children: Not on file   Years of education: Not on file   Highest education level: Not on file  Occupational History   Not on file  Tobacco Use   Smoking status: Never   Smokeless tobacco: Not on file  Substance and Sexual Activity   Alcohol use: Not Currently   Drug use: Not Currently   Sexual activity: Not on file  Other Topics Concern   Not on file  Social History Narrative   Not on file   Social Determinants of Health   Financial Resource Strain: Not on file  Food Insecurity: Not on file  Transportation Needs: Not on file  Physical Activity: Not on file  Stress: Not on file  Social Connections: Not on file    Allergies: Not on File  Metabolic Disorder Labs: No results found for: HGBA1C, MPG No results found for: PROLACTIN No results found for: CHOL, TRIG, HDL, CHOLHDL, VLDL, LDLCALC No results found for: TSH  Therapeutic Level Labs: No results found for: LITHIUM No results found for: VALPROATE No components found for:  CBMZ  Current Medications: Current Outpatient Medications  Medication Sig Dispense Refill   ramelteon (ROZEREM) 8 MG tablet Take 1 tablet (8 mg total) by mouth at bedtime. 30 tablet 1   buPROPion (WELLBUTRIN XL) 300 MG 24 hr tablet Take 1 tablet (300 mg total) by mouth every morning. 30 tablet 1   busPIRone (BUSPAR) 10 MG tablet Take 1 tablet (10 mg total) by mouth 2 (two) times daily. 60 tablet 1   citalopram (CELEXA) 40 MG tablet Take 1 tablet (40 mg total) by mouth daily. 30 tablet 1   hydrOXYzine (VISTARIL) 25 MG capsule Take 1 capsule (25 mg total) by mouth every 8 (eight) hours as needed for anxiety. 60 capsule 0   No current facility-administered medications for this visit.     Musculoskeletal: Strength & Muscle Tone: Unable to assess due to telemedicine visit Gait & Station: Unable to assess due to telemedicine visit Patient leans: Unable to  assess due to telemedicine visit  Psychiatric Specialty Exam: Review of Systems  Psychiatric/Behavioral:  Positive for sleep disturbance. Negative for decreased concentration, dysphoric mood, hallucinations, self-injury and suicidal ideas. The patient is nervous/anxious. The patient is not hyperactive.    There were no vitals taken for this visit.There is no height or weight on file to calculate BMI.  General Appearance: Well Groomed  Eye Contact:  Good  Speech:  Clear and Coherent and Normal Rate  Volume:  Normal  Mood:  Anxious and Depressed  Affect:  Congruent and Depressed  Thought Process:  Coherent, Goal Directed, and Descriptions of Associations: Intact  Orientation:  Full (Time, Place, and Person)  Thought Content: WDL   Suicidal Thoughts:  No  Homicidal Thoughts:  No  Memory:  Immediate;   Good Recent;   Good Remote;   Good  Judgement:  Fair  Insight:  Fair  Psychomotor Activity:  Normal  Concentration:  Concentration: Good and Attention Span: Good  Recall:  Good  Fund of Knowledge: Good  Language: Good  Akathisia:  NA  Handed:  Right  AIMS (if indicated): not done  Assets:  Communication Skills Desire for Improvement Financial Resources/Insurance Housing Social Support  ADL's:  Intact  Cognition: WNL  Sleep:  Fair   Screenings: GAD-7    Flowsheet Row Video Visit from 06/04/2021 in [facility] Video Visit from 04/02/2021 in [facility]  Total GAD-7 Score 18 18      PHQ2-9    Flowsheet Row Video Visit from 06/04/2021 in [facility] Video Visit from 04/02/2021 in [facility] ED from 09/16/2020 in [facility]  PHQ-2 Total Score 6 4 6   PHQ-9 Total Score 20 21 22       Flowsheet Row Video Visit from 06/04/2021 in [facility] Video Visit from 04/02/2021 in [facility] ED from 09/16/2020 in [facility]  C-SSRS RISK CATEGORY No Risk No Risk Error: Question 6 not populated        Assessment and Plan:   Elizabeth Barber is a 45 year old female with a past psychiatric history significant for major depressive disorder who presents to [facility] via virtual video visit for follow-up and medication management.  Patient endorses worsening depressive episodes with no discernible trigger.  Patient also has been experiencing worsening anxiety.  Patient was recommended increasing her dosage of bupropion from 150 mg to 300 mg daily.  Patient was also recommended increasing her buspirone dosage from 7.5 mg to 10 mg 2 times daily.  Due to not having good sleep while on trazodone, patient to be placed on ramelteon 8 mg at bedtime for the management of her sleep.  Patient was agreeable to recommendations.  Patient's medications to be e prescribed to pharmacy of choice.  1. MDD (major depressive disorder), recurrent, in partial remission (HCC)  - buPROPion (WELLBUTRIN XL) 300 MG 24 hr tablet; Take 1 tablet (300 mg total) by mouth every morning.  Dispense: 30 tablet; Refill: 1 - busPIRone (BUSPAR) 10 MG tablet; Take 1 tablet (10 mg total) by mouth 2 (two) times daily.  Dispense: 60 tablet; Refill: 1 - citalopram (CELEXA) 40 MG tablet; Take 1 tablet (40 mg total) by mouth daily.  Dispense: 30 tablet; Refill: 1  2. Generalized anxiety disorder  - busPIRone (BUSPAR) 10 MG tablet; Take 1 tablet (10 mg total) by mouth 2 (two) times daily.  Dispense: 60 tablet; Refill: 1 - citalopram (CELEXA) 40 MG tablet; Take 1 tablet (40 mg total) by mouth daily.  Dispense: 30 tablet; Refill: 1  3. Depression, recurrent (HCC)  - hydrOXYzine (VISTARIL) 25 MG capsule; Take 1 capsule (25 mg total) by mouth every 8 (eight) hours as needed for anxiety.  Dispense: 60 capsule; Refill: 0  4. Insomnia, unspecified type  -  ramelteon (ROZEREM) 8 MG tablet; Take 1 tablet (8 mg total) by mouth at bedtime.  Dispense: 30 tablet; Refill: 1  Patient to follow up in 6 weeks Provider spent a total of 25 minutes with the patient/reviewing patient's chart  57, PA 06/07/2021, 4:17 PM

## 2021-06-07 ENCOUNTER — Encounter (HOSPITAL_COMMUNITY): Payer: Self-pay | Admitting: Physician Assistant

## 2021-07-23 ENCOUNTER — Telehealth (INDEPENDENT_AMBULATORY_CARE_PROVIDER_SITE_OTHER): Payer: No Payment, Other | Admitting: Physician Assistant

## 2021-07-23 DIAGNOSIS — F411 Generalized anxiety disorder: Secondary | ICD-10-CM

## 2021-07-23 DIAGNOSIS — F3341 Major depressive disorder, recurrent, in partial remission: Secondary | ICD-10-CM | POA: Diagnosis not present

## 2021-07-23 DIAGNOSIS — G4700 Insomnia, unspecified: Secondary | ICD-10-CM | POA: Diagnosis not present

## 2021-07-23 NOTE — Progress Notes (Signed)
BH MD/PA/NP OP Progress Note  Virtual Visit via Video Note  I connected with Elizabeth Barber on 07/24/21 at  2:30 PM EDT by a video enabled telemedicine application and verified that I am speaking with the correct person using two identifiers.  Location: Patient: Home Provider: Clinic   I discussed the limitations of evaluation and management by telemedicine and the availability of in person appointments. The patient expressed understanding and agreed to proceed.  Follow Up Instructions:  I discussed the assessment and treatment plan with the patient. The patient was provided an opportunity to ask questions and all were answered. The patient agreed with the plan and demonstrated an understanding of the instructions.   The patient was advised to call back or seek an in-person evaluation if the symptoms worsen or if the condition fails to improve as anticipated.  I provided 21 minutes of non-face-to-face time during this encounter.  Meta Hatchet, PA    07/23/2021 2:33 PM Elizabeth Barber  MRN:  [id number]  Chief Complaint: Follow up and medication management  HPI:   Elizabeth Barber is a 45 year old female with a past psychiatric history significant for major depressive disorder, insomnia, and generalized anxiety disorder who presents to [facility] via virtual video visit for follow-up and medication management.  Patient is currently being managed on the following medications:  Ramelteon 8 mg at bedtime Hydroxyzine 25 mg 3 times daily as needed Buspirone 10 mg 3 times daily Citalopram 40 mg daily Bupropion (Wellbutrin XL) 300 mg 24-hour tablet daily  Patient reports that she is still experiencing the same amount of sleep she was experiencing before starting ramelteon.  Patient states that she wakes up roughly every 3-4 hours.  Patient denies eating excessively before bedtime nor does she drink coffee in the evening.  Patient also reports that her  mood has been up and down and all around.  Patient states that she is postmenopausal and believes that this may be contributing to her fluctuations in mood.  Patient denies being on any hormone replacement therapy at this time.  Patient endorses elevated anxiety and rates her anxiety an 8 out of 10.  Patient states that she has discontinued taking her buspirone after experiencing muscle jerks from the medication.  After discontinuing the medication, patient states that she does not experience any involuntary muscle jerks.  Patient endorses the following stressors: ongoing marital issues, issues with her teenage daughter, and the recent loss of her mother.  Patient states that her depressive symptoms have been more present especially since the passing of her mother.  Patient endorses the following symptoms: lack of motivation, difficulty getting out of bed, and decreased energy.  A PHQ-9 screen was performed with the patient scoring a 22.  A GAD-7 screen was also performed with the patient scoring an 18.  Patient is alert and oriented x4,, cooperative, and fully engaged in conversation during the encounter.  Patient endorses feeling depressed and irritable.  Due to her mood, patient states that she does not want to be around anyone.  Patient denies suicidal or homicidal ideations.  She further denies auditory or visual hallucinations and does not appear to be responding to internal/external stimuli.  Patient endorses fair sleep and receives on average 7 hours of broken sleep each night.  Patient endorses good appetite and eats on average 2 meals per day along with snacking a lot.  Patient denies alcohol consumption, tobacco use, and illicit drug use.  Visit Diagnosis:  ICD-10-CM   1. MDD (major depressive disorder), recurrent, in partial remission (HCC)  F33.41 buPROPion (WELLBUTRIN XL) 300 MG 24 hr tablet    citalopram (CELEXA) 10 MG tablet    FLUoxetine (PROZAC) 10 MG capsule    2. Insomnia,  unspecified type  G47.00 ramelteon (ROZEREM) 8 MG tablet    FLUoxetine (PROZAC) 10 MG capsule    3. Generalized anxiety disorder  F41.1 hydrOXYzine (VISTARIL) 25 MG capsule    citalopram (CELEXA) 10 MG tablet    FLUoxetine (PROZAC) 10 MG capsule      Past Psychiatric History:  Long history of depression, has taken several different medications over the course of last few years  Major depressive disorder Generalized anxiety disorder Insomnia  Past Medical History: History reviewed. No pertinent past medical history. History reviewed. No pertinent surgical history.  Family Psychiatric History:  Denied  Family History: History reviewed. No pertinent family history.  Social History:  Social History   Socioeconomic History   Marital status: Married    Spouse name: Not on file   Number of children: Not on file   Years of education: Not on file   Highest education level: Not on file  Occupational History   Not on file  Tobacco Use   Smoking status: Never   Smokeless tobacco: Not on file  Substance and Sexual Activity   Alcohol use: Not Currently   Drug use: Not Currently   Sexual activity: Not on file  Other Topics Concern   Not on file  Social History Narrative   Not on file   Social Determinants of Health   Financial Resource Strain: Not on file  Food Insecurity: Not on file  Transportation Needs: Not on file  Physical Activity: Not on file  Stress: Not on file  Social Connections: Not on file    Allergies: Not on File  Metabolic Disorder Labs: No results found for: HGBA1C, MPG No results found for: PROLACTIN No results found for: CHOL, TRIG, HDL, CHOLHDL, VLDL, LDLCALC No results found for: TSH  Therapeutic Level Labs: No results found for: LITHIUM No results found for: VALPROATE No components found for:  CBMZ  Current Medications: Current Outpatient Medications  Medication Sig Dispense Refill   FLUoxetine (PROZAC) 10 MG capsule Take 1 capsule (10  mg total) by mouth daily. 30 capsule 1   buPROPion (WELLBUTRIN XL) 300 MG 24 hr tablet Take 1 tablet (300 mg total) by mouth every morning. 30 tablet 1   busPIRone (BUSPAR) 10 MG tablet Take 1 tablet (10 mg total) by mouth 2 (two) times daily. 60 tablet 1   citalopram (CELEXA) 10 MG tablet Patient to take 10 mg for 3 days, followed by 5 mg (half tablet) for another 3 days before discontinuing medication. 5 tablet 0   hydrOXYzine (VISTARIL) 25 MG capsule Take 1 capsule (25 mg total) by mouth every 8 (eight) hours as needed for anxiety. 60 capsule 0   ramelteon (ROZEREM) 8 MG tablet Take 1 tablet (8 mg total) by mouth at bedtime. 30 tablet 1   No current facility-administered medications for this visit.     Musculoskeletal: Strength & Muscle Tone: Unable to assess due to telemedicine visit Gait & Station: Unable to assess due to telemedicine visit Patient leans: Unable to assess due to telemedicine visit  Psychiatric Specialty Exam: Review of Systems  Psychiatric/Behavioral:  Positive for sleep disturbance. Negative for decreased concentration, dysphoric mood, hallucinations, self-injury and suicidal ideas. The patient is nervous/anxious. The patient is not hyperactive.  There were no vitals taken for this visit.There is no height or weight on file to calculate BMI.  General Appearance: Well Groomed  Eye Contact:  Good  Speech:  Clear and Coherent and Normal Rate  Volume:  Normal  Mood:  Anxious and Depressed  Affect:  Congruent and Depressed  Thought Process:  Coherent, Goal Directed, and Descriptions of Associations: Intact  Orientation:  Full (Time, Place, and Person)  Thought Content: WDL   Suicidal Thoughts:  No  Homicidal Thoughts:  No  Memory:  Immediate;   Good Recent;   Good Remote;   Good  Judgement:  Fair  Insight:  Fair  Psychomotor Activity:  Normal  Concentration:  Concentration: Good and Attention Span: Good  Recall:  Good  Fund of Knowledge: Good  Language:  Good  Akathisia:  NA  Handed:  Right  AIMS (if indicated): not done  Assets:  Communication Skills Desire for Improvement Financial Resources/Insurance Housing Social Support  ADL's:  Intact  Cognition: WNL  Sleep:  Fair   Screenings: GAD-7    Flowsheet Row Video Visit from 07/23/2021 in [facility] Video Visit from 06/04/2021 in [facility] Video Visit from 04/02/2021 in [facility]  Total GAD-7 Score 18 18 18       PHQ2-9    Flowsheet Row Video Visit from 07/23/2021 in [facility] Video Visit from 06/04/2021 in [facility] Video Visit from 04/02/2021 in [facility] ED from 09/16/2020 in [facility]  PHQ-2 Total Score 6 6 4 6   PHQ-9 Total Score 22 20 21 22       Flowsheet Row Video Visit from 07/23/2021 in [facility] Video Visit from 06/04/2021 in [facility] Video Visit from 04/02/2021 in [facility]  C-SSRS RISK CATEGORY No Risk No Risk No Risk        Assessment and Plan:   Elizabeth Barber is a 45 year old female with a past psychiatric history significant for major depressive disorder, insomnia, and generalized anxiety disorder who presents to [facility] via virtual video visit for follow-up and medication management.  Patient continues to have issues with her sleep as well as experiencing depressive episodes.  Patient acknowledges that she is concerned about her weight and does not want to be on any sleep aid medications that may cause weight gain.  Patient is interested in continuing to take ramelteon for sleep aid.  Provider informed patient that she could double up on her medication to see if that helps with improving her sleep.   Patient states that she  has been on Prozac in the past and states that it was beneficial in the management of her depressive symptoms.  Provider recommended switching patient's citalopram with Prozac 10 mg daily for the management of her depression and anxiety.  Provider informed patient that she would need to taper off of citalopram.  Patient to take 20 mg of citalopram for 3 days ,followed by 10 mg for 3 more days, and lastly 5 mg for 3 days before discontinuing.  Patient was receptive to instruction.  Patient's medications to be e- prescribed to pharmacy of choice.  1. MDD (major depressive disorder), recurrent, in partial remission (HCC)  - buPROPion (WELLBUTRIN XL) 300 MG 24 hr tablet; Take 1 tablet (300 mg total) by mouth every morning.  Dispense: 30 tablet; Refill:  1 - citalopram (CELEXA) 10 MG tablet; Patient to take 10 mg for 3 days, followed by 5 mg (half tablet) for another 3 days before discontinuing medication.  Dispense: 5 tablet; Refill: 0 - FLUoxetine (PROZAC) 10 MG capsule; Take 1 capsule (10 mg total) by mouth daily.  Dispense: 30 capsule; Refill: 1  2. Insomnia, unspecified type  - ramelteon (ROZEREM) 8 MG tablet; Take 1 tablet (8 mg total) by mouth at bedtime.  Dispense: 30 tablet; Refill: 1 - FLUoxetine (PROZAC) 10 MG capsule; Take 1 capsule (10 mg total) by mouth daily.  Dispense: 30 capsule; Refill: 1  3. Generalized anxiety disorder  - hydrOXYzine (VISTARIL) 25 MG capsule; Take 1 capsule (25 mg total) by mouth every 8 (eight) hours as needed for anxiety.  Dispense: 60 capsule; Refill: 0 - citalopram (CELEXA) 10 MG tablet; Patient to take 10 mg for 3 days, followed by 5 mg (half tablet) for another 3 days before discontinuing medication.  Dispense: 5 tablet; Refill: 0 - FLUoxetine (PROZAC) 10 MG capsule; Take 1 capsule (10 mg total) by mouth daily.  Dispense: 30 capsule; Refill: 1  Patient to follow up in 2 months Provider spent a total of 21 minutes with the patient/reviewing patient's  chart  Meta Hatchet, PA 07/23/2021, 2:33 PM

## 2021-07-24 ENCOUNTER — Encounter (HOSPITAL_COMMUNITY): Payer: Self-pay | Admitting: Physician Assistant

## 2021-07-24 DIAGNOSIS — G4700 Insomnia, unspecified: Secondary | ICD-10-CM | POA: Insufficient documentation

## 2021-07-24 DIAGNOSIS — F411 Generalized anxiety disorder: Secondary | ICD-10-CM | POA: Insufficient documentation

## 2021-07-24 MED ORDER — RAMELTEON 8 MG PO TABS: 8.0000 mg | ORAL_TABLET | Freq: Every day | ORAL | 1 refills | Status: DC

## 2021-07-24 MED ORDER — HYDROXYZINE PAMOATE 25 MG PO CAPS: 25.0000 mg | ORAL_CAPSULE | Freq: Three times a day (TID) | ORAL | 0 refills | Status: DC | PRN

## 2021-07-24 MED ORDER — FLUOXETINE HCL 10 MG PO CAPS: 10.0000 mg | ORAL_CAPSULE | Freq: Every day | ORAL | 1 refills | Status: DC

## 2021-07-24 MED ORDER — CITALOPRAM HYDROBROMIDE 10 MG PO TABS
ORAL_TABLET | ORAL | 0 refills | Status: DC
Start: 2021-07-24 — End: 2021-09-29

## 2021-07-24 MED ORDER — BUPROPION HCL ER (XL) 300 MG PO TB24: 300.0000 mg | ORAL_TABLET | Freq: Every morning | ORAL | 1 refills | Status: DC

## 2021-09-24 ENCOUNTER — Telehealth (INDEPENDENT_AMBULATORY_CARE_PROVIDER_SITE_OTHER): Payer: No Payment, Other | Admitting: Physician Assistant

## 2021-09-24 DIAGNOSIS — G4700 Insomnia, unspecified: Secondary | ICD-10-CM | POA: Diagnosis not present

## 2021-09-24 DIAGNOSIS — F411 Generalized anxiety disorder: Secondary | ICD-10-CM | POA: Diagnosis not present

## 2021-09-24 DIAGNOSIS — F3341 Major depressive disorder, recurrent, in partial remission: Secondary | ICD-10-CM

## 2021-09-24 NOTE — Progress Notes (Signed)
BH MD/PA/NP OP Progress Note  Virtual Visit via Video Note  I connected with Elizabeth Barber on 09/24/21 at  2:30 PM EST by a video enabled telemedicine application and verified that I am speaking with the correct person using two identifiers.  Location: Patient: Home Provider: Clinic   I discussed the limitations of evaluation and management by telemedicine and the availability of in person appointments. The patient expressed understanding and agreed to proceed.  Follow Up Instructions:   I discussed the assessment and treatment plan with the patient. The patient was provided an opportunity to ask questions and all were answered. The patient agreed with the plan and demonstrated an understanding of the instructions.   The patient was advised to call back or seek an in-person evaluation if the symptoms worsen or if the condition fails to improve as anticipated.  I provided 11 minutes of non-face-to-face time during this encounter.  Elizabeth Hatchet, PA   09/24/2021 2:51 PM Elizabeth Barber  MRN:  [id number]  Chief Complaint: Follow up and medication management  HPI:   Elizabeth Barber is a 46 year old female with a past psychiatric history significant for insomnia, major depressive disorder, and generalized anxiety disorder who presents to [facility] via virtual video visit for follow-up and medication management.  Patient is currently being managed on the following medications:  Bupropion (Wellbutrin XL) 300 mg 24-hour tablet daily Fluoxetine 10 mg daily Ramelteon 8 mg at bedtime Hydroxyzine 25 mg 8 hours as needed  Patient reports that she has not been taking Wellbutrin due to not having access to the medication.  She reports that she last took Wellbutrin a month and a half ago before running out.  She notes that she has not been doing well due to being without her Wellbutrin.  Patient endorses feelings of sadness and overall low mood which these  may be due to the holidays.  Patient also endorses low energy and lack of motivation.  Patient denies experiencing any adverse side effects while on Wellbutrin.  Provider informed patient that a new prescription will be placed following the conclusion of the encounter.  Patient denies any significant changes to her anxiety and states that her anxiety continues to be all over the place.  Patient rates her anxiety an 8 out of 10.  Patient's main dresser involves ongoing issues within her marriage.  PHQ-9 screen was performed with the patient scoring a 17.  A GAD-7 screen was also performed with the patient scoring a 19.  Patient is alert and oriented x4, calm, cooperative, and fully engaged in conversation during the encounter.  Patient endorses okay mood, however, she feels somewhat irritable.  Patient denies suicidal or homicidal ideations.  She further denies auditory or visual hallucinations and does not appear to be responding to internal/external stimuli.  Patient endorses poor sleep and receives on average 3 to 4 hours of intermittent sleep.  Patient endorses good appetite and eats on average 2 meals per day along with a snack.  Patient denies alcohol consumption, tobacco use, and illicit drug use.  Visit Diagnosis:    ICD-10-CM   1. Insomnia, unspecified type  G47.00 ramelteon (ROZEREM) 8 MG tablet    FLUoxetine (PROZAC) 10 MG capsule    2. MDD (major depressive disorder), recurrent, in partial remission (HCC)  F33.41 buPROPion (WELLBUTRIN XL) 150 MG 24 hr tablet    FLUoxetine (PROZAC) 10 MG capsule    3. Generalized anxiety disorder  F41.1 hydrOXYzine (VISTARIL) 25 MG capsule  FLUoxetine (PROZAC) 10 MG capsule      Past Psychiatric History:  Long history of depression, has taken several different medications over the course of last few years   Major depressive disorder Generalized anxiety disorder Insomnia  Past Medical History: History reviewed. No pertinent past medical history.  History reviewed. No pertinent surgical history.  Family Psychiatric History:  Denied  Family History: History reviewed. No pertinent family history.  Social History:  Social History   Socioeconomic History   Marital status: Married    Spouse name: Not on file   Number of children: Not on file   Years of education: Not on file   Highest education level: Not on file  Occupational History   Not on file  Tobacco Use   Smoking status: Never   Smokeless tobacco: Not on file  Substance and Sexual Activity   Alcohol use: Not Currently   Drug use: Not Currently   Sexual activity: Not on file  Other Topics Concern   Not on file  Social History Narrative   Not on file   Social Determinants of Health   Financial Resource Strain: Not on file  Food Insecurity: Not on file  Transportation Needs: Not on file  Physical Activity: Not on file  Stress: Not on file  Social Connections: Not on file    Allergies: Not on File  Metabolic Disorder Labs: No results found for: HGBA1C, MPG No results found for: PROLACTIN No results found for: CHOL, TRIG, HDL, CHOLHDL, VLDL, LDLCALC No results found for: TSH  Therapeutic Level Labs: No results found for: LITHIUM No results found for: VALPROATE No components found for:  CBMZ  Current Medications: Current Outpatient Medications  Medication Sig Dispense Refill   buPROPion (WELLBUTRIN XL) 150 MG 24 hr tablet Patient to take 1 tablet (150 mg total) for 6 days then continue taking 2 tablets (300 mg total) daily 30 tablet 0   busPIRone (BUSPAR) 10 MG tablet Take 1 tablet (10 mg total) by mouth 2 (two) times daily. 60 tablet 1   FLUoxetine (PROZAC) 10 MG capsule Take 1 capsule (10 mg total) by mouth daily. 30 capsule 1   hydrOXYzine (VISTARIL) 25 MG capsule Take 1 capsule (25 mg total) by mouth every 8 (eight) hours as needed for anxiety. 60 capsule 0   ramelteon (ROZEREM) 8 MG tablet Take 1 tablet (8 mg total) by mouth at bedtime. 30 tablet 1    No current facility-administered medications for this visit.     Musculoskeletal: Strength & Muscle Tone: Unable to assess due to telemedicine visit Gait & Station: Unable to assess due to telemedicine visit Patient leans: Unable to assess due to telemedicine visit  Psychiatric Specialty Exam: Review of Systems  Psychiatric/Behavioral:  Positive for sleep disturbance. Negative for decreased concentration, dysphoric mood, hallucinations, self-injury and suicidal ideas. The patient is nervous/anxious. The patient is not hyperactive.    There were no vitals taken for this visit.There is no height or weight on file to calculate BMI.  General Appearance: Well Groomed  Eye Contact:  Good  Speech:  Clear and Coherent and Normal Rate  Volume:  Normal  Mood:  Anxious and Depressed  Affect:  Congruent  Thought Process:  Coherent, Goal Directed, and Descriptions of Associations: Intact  Orientation:  Full (Time, Place, and Person)  Thought Content: WDL   Suicidal Thoughts:  No  Homicidal Thoughts:  No  Memory:  Immediate;   Good Recent;   Good Remote;   Good  Judgement:  Fair  Insight:  Fair  Psychomotor Activity:  Normal  Concentration:  Concentration: Good and Attention Span: Good  Recall:  Good  Fund of Knowledge: Good  Language: Good  Akathisia:  NA  Handed:  Right  AIMS (if indicated): not done  Assets:  Communication Skills Desire for Improvement Financial Resources/Insurance Housing Social Support  ADL's:  Intact  Cognition: WNL  Sleep:  Fair   Screenings: GAD-7    Flowsheet Row Video Visit from 09/24/2021 in [facility] Video Visit from 07/23/2021 in [facility] Video Visit from 06/04/2021 in [facility] Video Visit from 04/02/2021 in [facility]  Total GAD-7 Score 19 18 18 18       PHQ2-9    Flowsheet Row Video Visit from 09/24/2021 in [facility] Video Visit from 07/23/2021 in [facility] Video Visit from 06/04/2021 in [facility] Video Visit from 04/02/2021 in [facility] ED from 09/16/2020 in [facility]  PHQ-2 Total Score 4 6 6 4 6   PHQ-9 Total Score 17 22 20 21 22       Flowsheet Row Video Visit from 09/24/2021 in [facility] Video Visit from 07/23/2021 in [facility] Video Visit from 06/04/2021 in [facility]  C-SSRS RISK CATEGORY No Risk No Risk No Risk        Assessment and Plan:   Elizabeth Barber is a 46 year old female with a past psychiatric history significant for insomnia, major depressive disorder, and generalized anxiety disorder who presents to [facility] via virtual video visit for follow-up and medication management.  Patient endorses depressive episodes characterized by the following symptoms: feelings of sadness, low energy, and lack of motivation.  Patient's symptoms could be attributed to holidays as well as being without her Wellbutrin for some time.  Provider recommended patient take Wellbutrin 150 mg daily for 6 days followed by 300 mg (2 tablets) daily for the management of her depressive symptoms.  Patient was agreeable to recommendation.  Patient's medications to be e-prescribed to pharmacy of choice.  1. Insomnia, unspecified type  - ramelteon (ROZEREM) 8 MG tablet; Take 1 tablet (8 mg total) by mouth at bedtime.  Dispense: 30 tablet; Refill: 1 - FLUoxetine (PROZAC) 10 MG capsule; Take 1 capsule (10 mg total) by mouth daily.  Dispense: 30 capsule; Refill: 1  2. MDD (major depressive disorder), recurrent, in partial remission (HCC)  - buPROPion (WELLBUTRIN XL) 150 MG 24 hr tablet; Patient to take 1 tablet (150 mg total) for 6 days then continue taking 2  tablets (300 mg total) daily  Dispense: 30 tablet; Refill: 0 - FLUoxetine (PROZAC) 10 MG capsule; Take 1 capsule (10 mg total) by mouth daily.  Dispense: 30 capsule; Refill: 1  3. Generalized anxiety disorder  - hydrOXYzine (VISTARIL) 25 MG capsule; Take 1 capsule (25 mg total) by mouth every 8 (eight) hours as needed for anxiety.  Dispense: 60 capsule; Refill: 0 - FLUoxetine (PROZAC) 10 MG capsule; Take 1 capsule (10 mg total) by mouth daily.  Dispense: 30 capsule; Refill: 1  Patient to follow up in 2 months Provider spent a total of 11 minutes with the patient/reviewing patient's chart  Fara Chute, PA 09/24/2021, 2:51 PM

## 2021-09-29 ENCOUNTER — Telehealth (HOSPITAL_COMMUNITY): Payer: Self-pay | Admitting: *Deleted

## 2021-09-29 MED ORDER — FLUOXETINE HCL 10 MG PO CAPS: 10.0000 mg | ORAL_CAPSULE | Freq: Every day | ORAL | 1 refills | Status: DC

## 2021-09-29 MED ORDER — HYDROXYZINE PAMOATE 25 MG PO CAPS: 25.0000 mg | ORAL_CAPSULE | Freq: Three times a day (TID) | ORAL | 0 refills | Status: AC | PRN

## 2021-09-29 MED ORDER — RAMELTEON 8 MG PO TABS: 8.0000 mg | ORAL_TABLET | Freq: Every day | ORAL | 1 refills | Status: AC

## 2021-09-29 MED ORDER — BUPROPION HCL ER (XL) 150 MG PO TB24: ORAL_TABLET | ORAL | 0 refills | Status: DC

## 2021-09-29 NOTE — Telephone Encounter (Signed)
Provider was contacted by Orpah Clinton. Reola Calkins, RN regarding patient's medication refill. Patient's medication to e-prescribed to pharmacy of choice.

## 2021-09-29 NOTE — Telephone Encounter (Signed)
Fax request from her pharmacy for a new rx on her fluoxetine. Reviewed record and she should be out. Will forward request to [facility] for him to renew.

## 2021-10-03 ENCOUNTER — Encounter (HOSPITAL_COMMUNITY): Payer: Self-pay | Admitting: Physician Assistant

## 2021-10-21 ENCOUNTER — Telehealth (HOSPITAL_COMMUNITY): Payer: Self-pay | Admitting: *Deleted

## 2021-10-21 ENCOUNTER — Other Ambulatory Visit (HOSPITAL_COMMUNITY): Payer: Self-pay | Admitting: Physician Assistant

## 2021-10-21 DIAGNOSIS — F3341 Major depressive disorder, recurrent, in partial remission: Secondary | ICD-10-CM

## 2021-10-21 MED ORDER — BUPROPION HCL ER (XL) 150 MG PO TB24: ORAL_TABLET | ORAL | 0 refills | Status: DC

## 2021-10-21 NOTE — Progress Notes (Signed)
Provider was contacted by Suzanne K. Beck, RN regarding patient's medication refill. Patient's medication to be e-prescribed to pharmacy of choice.

## 2021-10-21 NOTE — Telephone Encounter (Signed)
Pharmacy request faxed over for her Bupropion XL to be reordered. She has a future appt on 11/26/21 and should be out of her current supply on or around 10/28/21. Will forward request to her provider.

## 2021-10-21 NOTE — Telephone Encounter (Signed)
Provider was contacted by Suzanne K. Beck, RN regarding patient's medication refill. Patient's medication to be e-prescribed to pharmacy of choice.

## 2021-11-23 ENCOUNTER — Telehealth (HOSPITAL_COMMUNITY): Payer: Self-pay | Admitting: *Deleted

## 2021-11-23 ENCOUNTER — Other Ambulatory Visit (HOSPITAL_COMMUNITY): Payer: Self-pay | Admitting: Physician Assistant

## 2021-11-23 DIAGNOSIS — F3341 Major depressive disorder, recurrent, in partial remission: Secondary | ICD-10-CM

## 2021-11-23 DIAGNOSIS — F411 Generalized anxiety disorder: Secondary | ICD-10-CM

## 2021-11-23 DIAGNOSIS — G4700 Insomnia, unspecified: Secondary | ICD-10-CM

## 2021-11-23 MED ORDER — FLUOXETINE HCL 10 MG PO CAPS: 10.0000 mg | ORAL_CAPSULE | Freq: Every day | ORAL | 2 refills | Status: DC

## 2021-11-23 NOTE — Progress Notes (Signed)
Provider was contacted by Elizabeth Barber, RMA regarding patient's medication management. Patient's medication to be e-prescribed to pharmacy of choice. 

## 2021-11-23 NOTE — Telephone Encounter (Signed)
Provider was contacted by Direce E. McIntyre, RMA regarding patient's medication management. Patient's medication to be e-prescribed to pharmacy of choice. 

## 2021-11-23 NOTE — Telephone Encounter (Signed)
[facility] SENT REFILL REQUEST  ? FLUoxetine (PROZAC) 10 MG capsule ? ?NEXT APPT VIRTUAL 11/26/21 ? ?

## 2021-11-26 ENCOUNTER — Encounter (HOSPITAL_COMMUNITY): Payer: Self-pay

## 2021-11-26 ENCOUNTER — Telehealth (HOSPITAL_COMMUNITY): Payer: No Payment, Other | Admitting: Physician Assistant

## 2022-01-10 ENCOUNTER — Telehealth: Payer: Self-pay | Admitting: *Deleted

## 2022-01-10 NOTE — Telephone Encounter (Signed)
Rx REFILL REQUEST  ? ?buPROPion (WELLBUTRIN XL) ?(300 mg total) daily ? ? ?

## 2022-01-11 ENCOUNTER — Telehealth (HOSPITAL_COMMUNITY): Payer: Self-pay | Admitting: *Deleted

## 2022-01-11 ENCOUNTER — Other Ambulatory Visit (HOSPITAL_COMMUNITY): Payer: Self-pay | Admitting: Physician Assistant

## 2022-01-11 DIAGNOSIS — F3341 Major depressive disorder, recurrent, in partial remission: Secondary | ICD-10-CM

## 2022-01-11 MED ORDER — BUPROPION HCL ER (XL) 300 MG PO TB24: 300.0000 mg | ORAL_TABLET | Freq: Every day | ORAL | 0 refills | Status: AC

## 2022-01-11 NOTE — Telephone Encounter (Signed)
Message acknowledged and reviewed.

## 2022-01-11 NOTE — Progress Notes (Signed)
Provider was contacted by Suzanne K. Beck, RN regarding patient's medication.  At her informed lyric that patient had missed March appointment and needs to be rescheduled a follow-up appointment in order to receive her next prescription for Wellbutrin.  Patient has been scheduled for 02/01/2022.  Provider to bridge patient's medication until her next encounter.

## 2022-01-11 NOTE — Telephone Encounter (Signed)
Provider was contacted by Orpah Clinton. Reola Calkins, RN regarding patient being out of bupropion.  Patient has been given a follow-up appointment scheduled for 02/01/2022.  Patient's medication has been e-prescribed to pharmacy of choice.

## 2022-01-11 NOTE — Telephone Encounter (Signed)
Pharmacy fax request for Wellbutrin to be sent in to the pharmacy. She missed her March appt with her provider and does not  have  future appt scheduled. WIll call her to have her make an appt and then ask Eddie PA to bridge her rx to her next appt.  ?

## 2022-01-11 NOTE — Telephone Encounter (Signed)
Called patient to discuss follow up appt and meds till the new appt. States she is not in need of Bupropion at this time. Told her the pharmacy request for this med had me review her record and see she had no future appt. She would like to make an appt and transferred her to the front desk to reschedule. ?

## 2022-01-11 NOTE — Telephone Encounter (Signed)
Message acknowledged and received.

## 2022-01-11 NOTE — Telephone Encounter (Signed)
Provider was contacted by Orpah Clinton. Reola Calkins, RN regarding patient's medication.  At her informed lyric that patient had missed March appointment and needs to be rescheduled a follow-up appointment in order to receive her next prescription for Wellbutrin.  Patient has been scheduled for 02/01/2022.  Provider to bridge patient's medication until her next encounter.

## 2022-02-01 ENCOUNTER — Telehealth (HOSPITAL_COMMUNITY): Payer: No Payment, Other | Admitting: Physician Assistant

## 2022-02-01 ENCOUNTER — Encounter (HOSPITAL_COMMUNITY): Payer: Self-pay

## 2022-03-09 ENCOUNTER — Telehealth (HOSPITAL_COMMUNITY): Payer: Self-pay | Admitting: *Deleted

## 2022-03-09 NOTE — Telephone Encounter (Signed)
Fax request from [facility] for patients fluoxetine. Denied request as patient has not been seen by a provider since 1/23 and has no future appt scheduled.

## 2022-03-24 ENCOUNTER — Telehealth (HOSPITAL_COMMUNITY): Payer: Self-pay | Admitting: *Deleted

## 2022-03-24 ENCOUNTER — Other Ambulatory Visit (HOSPITAL_COMMUNITY): Payer: Self-pay | Admitting: Psychiatry

## 2022-03-24 DIAGNOSIS — G4700 Insomnia, unspecified: Secondary | ICD-10-CM

## 2022-03-24 DIAGNOSIS — F3341 Major depressive disorder, recurrent, in partial remission: Secondary | ICD-10-CM

## 2022-03-24 DIAGNOSIS — F411 Generalized anxiety disorder: Secondary | ICD-10-CM

## 2022-03-24 MED ORDER — FLUOXETINE HCL 10 MG PO CAPS: 10.0000 mg | ORAL_CAPSULE | Freq: Every day | ORAL | 3 refills | Status: AC

## 2022-03-24 NOTE — Telephone Encounter (Signed)
Elizabeth Barber  OUT OF OFFICE SENDING REFILL REQUEST TO Dr B PARSONS.  RX REFILL: FLUoxetine (PROZAC) 10 MG capsule Take 1 capsule (10 mg total) by mouth daily

## 2022-03-24 NOTE — Telephone Encounter (Signed)
Medication refilled and sent to preferred pharmacy

## 2023-02-20 ENCOUNTER — Other Ambulatory Visit: Payer: Self-pay | Admitting: Internal Medicine

## 2024-07-05 ENCOUNTER — Other Ambulatory Visit: Payer: Self-pay | Admitting: Internal Medicine

## 2024-07-05 DIAGNOSIS — Z1231 Encounter for screening mammogram for malignant neoplasm of breast: Secondary | ICD-10-CM
